# Patient Record
Sex: Female | Born: 1955 | Race: White | Hispanic: No | Marital: Married | Smoking: Never smoker
Health system: Southern US, Community
[De-identification: ages and names within clinical notes are randomized; demographics above are authoritative.]

## PROBLEM LIST (undated history)

## (undated) DIAGNOSIS — I872 Venous insufficiency (chronic) (peripheral): Secondary | ICD-10-CM

## (undated) DIAGNOSIS — Z789 Other specified health status: Secondary | ICD-10-CM

## (undated) HISTORY — PX: TUBAL LIGATION: SHX77

## (undated) HISTORY — DX: Venous insufficiency (chronic) (peripheral): I87.2

## (undated) HISTORY — PX: FOOT SURGERY: SHX648

## (undated) HISTORY — PX: UTERINE FIBROID SURGERY: SHX826

## (undated) HISTORY — PX: BREAST LUMPECTOMY: SHX2

---

## 1999-07-08 ENCOUNTER — Encounter: Payer: Self-pay | Admitting: Obstetrics and Gynecology

## 1999-07-08 ENCOUNTER — Encounter: Admission: RE | Admit: 1999-07-08 | Discharge: 1999-07-08 | Payer: Self-pay | Admitting: Obstetrics and Gynecology

## 2000-07-11 ENCOUNTER — Encounter: Payer: Self-pay | Admitting: Obstetrics and Gynecology

## 2000-07-11 ENCOUNTER — Encounter: Admission: RE | Admit: 2000-07-11 | Discharge: 2000-07-11 | Payer: Self-pay | Admitting: Obstetrics and Gynecology

## 2001-01-02 ENCOUNTER — Emergency Department (HOSPITAL_COMMUNITY): Admission: EM | Admit: 2001-01-02 | Discharge: 2001-01-02 | Payer: Self-pay | Admitting: Emergency Medicine

## 2001-01-02 ENCOUNTER — Encounter: Payer: Self-pay | Admitting: Emergency Medicine

## 2007-01-22 ENCOUNTER — Emergency Department (HOSPITAL_COMMUNITY): Admission: EM | Admit: 2007-01-22 | Discharge: 2007-01-22 | Payer: Self-pay | Admitting: Emergency Medicine

## 2007-11-13 ENCOUNTER — Ambulatory Visit (HOSPITAL_BASED_OUTPATIENT_CLINIC_OR_DEPARTMENT_OTHER): Admission: RE | Admit: 2007-11-13 | Discharge: 2007-11-13 | Payer: Self-pay | Admitting: Orthopaedic Surgery

## 2008-04-18 ENCOUNTER — Emergency Department (HOSPITAL_COMMUNITY): Admission: EM | Admit: 2008-04-18 | Discharge: 2008-04-18 | Payer: Self-pay | Admitting: Emergency Medicine

## 2009-10-01 ENCOUNTER — Ambulatory Visit (HOSPITAL_COMMUNITY): Admission: RE | Admit: 2009-10-01 | Discharge: 2009-10-01 | Payer: Self-pay | Admitting: Obstetrics and Gynecology

## 2009-10-16 ENCOUNTER — Encounter: Admission: RE | Admit: 2009-10-16 | Discharge: 2009-10-16 | Payer: Self-pay | Admitting: Obstetrics and Gynecology

## 2010-01-15 ENCOUNTER — Encounter (INDEPENDENT_AMBULATORY_CARE_PROVIDER_SITE_OTHER): Payer: Self-pay | Admitting: Obstetrics & Gynecology

## 2010-01-15 ENCOUNTER — Ambulatory Visit (HOSPITAL_COMMUNITY): Admission: RE | Admit: 2010-01-15 | Discharge: 2010-01-16 | Payer: Self-pay | Admitting: Obstetrics & Gynecology

## 2010-07-15 LAB — SURGICAL PCR SCREEN
MRSA, PCR: NEGATIVE
Staphylococcus aureus: NEGATIVE

## 2010-07-15 LAB — CBC
HCT: 39.2 % (ref 36.0–46.0)
Hemoglobin: 13.1 g/dL (ref 12.0–15.0)
MCV: 93.2 fL (ref 78.0–100.0)
Platelets: 224 10*3/uL (ref 150–400)
RBC: 3.24 MIL/uL — ABNORMAL LOW (ref 3.87–5.11)
RBC: 4.12 MIL/uL (ref 3.87–5.11)
RDW: 13.9 % (ref 11.5–15.5)
WBC: 9.7 10*3/uL (ref 4.0–10.5)
WBC: 6.9 10*3/uL (ref 4.0–10.5)

## 2010-07-15 LAB — TYPE AND SCREEN: Antibody Screen: NEGATIVE

## 2010-07-15 LAB — ABO/RH: ABO/RH(D): O POS

## 2011-02-03 LAB — POCT I-STAT, CHEM 8
BUN: 27 mg/dL — ABNORMAL HIGH (ref 6–23)
Creatinine, Ser: 1.1 mg/dL (ref 0.4–1.2)
Glucose, Bld: 129 mg/dL — ABNORMAL HIGH (ref 70–99)
Hemoglobin: 13.6 g/dL (ref 12.0–15.0)
Potassium: 3.9 mEq/L (ref 3.5–5.1)
Sodium: 140 mEq/L (ref 135–145)
TCO2: 25 mmol/L (ref 0–100)

## 2011-02-03 LAB — URINE MICROSCOPIC-ADD ON

## 2011-02-03 LAB — URINALYSIS, ROUTINE W REFLEX MICROSCOPIC
Nitrite: NEGATIVE
Protein, ur: NEGATIVE mg/dL
Specific Gravity, Urine: 1.021 (ref 1.005–1.030)
Urobilinogen, UA: 0.2 mg/dL (ref 0.0–1.0)

## 2011-02-03 LAB — PREGNANCY, URINE: Preg Test, Ur: NEGATIVE

## 2011-02-10 LAB — BASIC METABOLIC PANEL
BUN: 18
CO2: 27
GFR calc non Af Amer: 50 — ABNORMAL LOW
Glucose, Bld: 128 — ABNORMAL HIGH
Potassium: 5
Sodium: 139

## 2011-02-10 LAB — DIFFERENTIAL
Basophils Absolute: 0.1
Basophils Relative: 1
Eosinophils Absolute: 0
Eosinophils Relative: 0
Monocytes Absolute: 0.6

## 2011-02-10 LAB — CBC
HCT: 39.2
Hemoglobin: 13.4
MCHC: 34.2
Platelets: 293
RDW: 13.6

## 2011-02-10 LAB — URINALYSIS, ROUTINE W REFLEX MICROSCOPIC
Bilirubin Urine: NEGATIVE
Nitrite: NEGATIVE
Protein, ur: NEGATIVE
Urobilinogen, UA: 0.2

## 2011-02-10 LAB — URINE MICROSCOPIC-ADD ON

## 2011-05-27 ENCOUNTER — Inpatient Hospital Stay (HOSPITAL_COMMUNITY): Payer: BC Managed Care – PPO

## 2011-05-27 ENCOUNTER — Emergency Department (HOSPITAL_COMMUNITY): Payer: BC Managed Care – PPO

## 2011-05-27 ENCOUNTER — Other Ambulatory Visit: Payer: Self-pay

## 2011-05-27 ENCOUNTER — Encounter (HOSPITAL_COMMUNITY): Payer: Self-pay | Admitting: *Deleted

## 2011-05-27 ENCOUNTER — Inpatient Hospital Stay (HOSPITAL_COMMUNITY)
Admission: AD | Admit: 2011-05-27 | Discharge: 2011-06-06 | DRG: 584 | Disposition: A | Discharge status: Home Health | Payer: BC Managed Care – PPO | Source: Ambulatory Visit | Attending: Pulmonary Disease | Admitting: Pulmonary Disease

## 2011-05-27 DIAGNOSIS — R112 Nausea with vomiting, unspecified: Secondary | ICD-10-CM

## 2011-05-27 DIAGNOSIS — J690 Pneumonitis due to inhalation of food and vomit: Secondary | ICD-10-CM

## 2011-05-27 DIAGNOSIS — A419 Sepsis, unspecified organism: Principal | ICD-10-CM | POA: Diagnosis present

## 2011-05-27 DIAGNOSIS — R7989 Other specified abnormal findings of blood chemistry: Secondary | ICD-10-CM | POA: Diagnosis present

## 2011-05-27 DIAGNOSIS — J9690 Respiratory failure, unspecified, unspecified whether with hypoxia or hypercapnia: Secondary | ICD-10-CM

## 2011-05-27 DIAGNOSIS — J9600 Acute respiratory failure, unspecified whether with hypoxia or hypercapnia: Secondary | ICD-10-CM | POA: Diagnosis present

## 2011-05-27 DIAGNOSIS — R404 Transient alteration of awareness: Secondary | ICD-10-CM | POA: Diagnosis present

## 2011-05-27 DIAGNOSIS — J984 Other disorders of lung: Secondary | ICD-10-CM

## 2011-05-27 DIAGNOSIS — N179 Acute kidney failure, unspecified: Secondary | ICD-10-CM

## 2011-05-27 DIAGNOSIS — E870 Hyperosmolality and hypernatremia: Secondary | ICD-10-CM | POA: Diagnosis present

## 2011-05-27 DIAGNOSIS — R6520 Severe sepsis without septic shock: Secondary | ICD-10-CM

## 2011-05-27 DIAGNOSIS — R6521 Severe sepsis with septic shock: Secondary | ICD-10-CM | POA: Diagnosis present

## 2011-05-27 DIAGNOSIS — R410 Disorientation, unspecified: Secondary | ICD-10-CM

## 2011-05-27 HISTORY — DX: Other specified health status: Z78.9

## 2011-05-27 LAB — DIFFERENTIAL
Eosinophils Absolute: 0 10*3/uL (ref 0.0–0.7)
Eosinophils Relative: 0 % (ref 0–5)
Lymphocytes Relative: 9 % — ABNORMAL LOW (ref 12–46)
Lymphs Abs: 0.9 10*3/uL (ref 0.7–4.0)
Monocytes Absolute: 0.6 10*3/uL (ref 0.1–1.0)
Monocytes Relative: 6 % (ref 3–12)

## 2011-05-27 LAB — BASIC METABOLIC PANEL
CO2: 24 mEq/L (ref 19–32)
Calcium: 7.6 mg/dL — ABNORMAL LOW (ref 8.4–10.5)
Chloride: 107 mEq/L (ref 96–112)
Glucose, Bld: 140 mg/dL — ABNORMAL HIGH (ref 70–99)
Potassium: 4.8 mEq/L (ref 3.5–5.1)
Sodium: 139 mEq/L (ref 135–145)

## 2011-05-27 LAB — URINALYSIS, ROUTINE W REFLEX MICROSCOPIC
Bilirubin Urine: NEGATIVE
Glucose, UA: NEGATIVE mg/dL
Hgb urine dipstick: NEGATIVE
Ketones, ur: NEGATIVE mg/dL
Leukocytes, UA: NEGATIVE
Nitrite: NEGATIVE
Protein, ur: 30 mg/dL — AB
Protein, ur: 30 mg/dL — AB
Specific Gravity, Urine: 1.027 (ref 1.005–1.030)
Urobilinogen, UA: 0.2 mg/dL (ref 0.0–1.0)
Urobilinogen, UA: 0.2 mg/dL (ref 0.0–1.0)

## 2011-05-27 LAB — GLUCOSE, CAPILLARY: Glucose-Capillary: 107 mg/dL — ABNORMAL HIGH (ref 70–99)

## 2011-05-27 LAB — COMPREHENSIVE METABOLIC PANEL
ALT: 191 U/L — ABNORMAL HIGH (ref 0–35)
BUN: 31 mg/dL — ABNORMAL HIGH (ref 6–23)
CO2: 26 mEq/L (ref 19–32)
Calcium: 9.4 mg/dL (ref 8.4–10.5)
GFR calc Af Amer: 21 mL/min — ABNORMAL LOW (ref >90)
GFR calc non Af Amer: 18 mL/min — ABNORMAL LOW (ref >90)
Glucose, Bld: 160 mg/dL — ABNORMAL HIGH (ref 70–99)
Sodium: 140 mEq/L (ref 135–145)
Total Protein: 7.1 g/dL (ref 6.0–8.3)

## 2011-05-27 LAB — CBC
HCT: 45.5 % (ref 36.0–46.0)
Hemoglobin: 14.3 g/dL (ref 12.0–15.0)
MCH: 30.6 pg (ref 26.0–34.0)
MCV: 97.2 fL (ref 78.0–100.0)
Platelets: 378 10*3/uL (ref 150–400)
RBC: 4.68 MIL/uL (ref 3.87–5.11)
WBC: 10.3 10*3/uL (ref 4.0–10.5)

## 2011-05-27 LAB — RAPID URINE DRUG SCREEN, HOSP PERFORMED
Amphetamines: NOT DETECTED
Benzodiazepines: NOT DETECTED
Cocaine: NOT DETECTED
Opiates: NOT DETECTED

## 2011-05-27 LAB — TYPE AND SCREEN
ABO/RH(D): O POS
Antibody Screen: NEGATIVE

## 2011-05-27 LAB — CARBOXYHEMOGLOBIN
Methemoglobin: 0.5 % (ref 0.0–1.5)
Methemoglobin: 1 % (ref 0.0–1.5)
O2 Saturation: 62.4 %
Total hemoglobin: 12.8 g/dL (ref 12.5–16.0)
Total hemoglobin: 12.2 g/dL — ABNORMAL LOW (ref 12.5–16.0)
Total hemoglobin: 11.7 g/dL — ABNORMAL LOW (ref 12.5–16.0)

## 2011-05-27 LAB — POCT I-STAT 3, ART BLOOD GAS (G3+)
Acid-base deficit: 4 mmol/L — ABNORMAL HIGH (ref 0.0–2.0)
O2 Saturation: 95 %
TCO2: 23 mmol/L (ref 0–100)
pCO2 arterial: 43.2 mmHg (ref 35.0–45.0)
pO2, Arterial: 80 mmHg (ref 80.0–100.0)

## 2011-05-27 LAB — LIPASE, BLOOD: Lipase: 152 U/L — ABNORMAL HIGH (ref 11–59)

## 2011-05-27 LAB — INFLUENZA PANEL BY PCR (TYPE A & B)
H1N1 flu by pcr: NOT DETECTED
Influenza B By PCR: NEGATIVE

## 2011-05-27 LAB — URINE MICROSCOPIC-ADD ON

## 2011-05-27 LAB — FIBRINOGEN: Fibrinogen: 392 mg/dL (ref 204–475)

## 2011-05-27 LAB — ACETAMINOPHEN LEVEL: Acetaminophen (Tylenol), Serum: <15 ug/mL (ref 10–30)

## 2011-05-27 LAB — LACTIC ACID, PLASMA
Lactic Acid, Venous: 5.4 mmol/L — ABNORMAL HIGH (ref 0.5–2.2)
Lactic Acid, Venous: 2.8 mmol/L — ABNORMAL HIGH (ref 0.5–2.2)

## 2011-05-27 LAB — GASTRIC OCCULT BLOOD (1-CARD TO LAB): Occult Blood, Gastric: POSITIVE — AB

## 2011-05-27 LAB — ETHANOL: Alcohol, Ethyl (B): <11 mg/dL (ref 0–11)

## 2011-05-27 MED ORDER — SODIUM CHLORIDE 0.9 % IV SOLN
2.0000 mg/h | INTRAVENOUS | Status: DC
  Administered 2011-05-27 – 2011-05-29 (×3): 3 mg/h via INTRAVENOUS
  Filled 2011-05-27 (×4): qty 10

## 2011-05-27 MED ORDER — SODIUM CHLORIDE 0.9 % IV BOLUS (SEPSIS)
1000.0000 mL | Freq: Once | INTRAVENOUS | Status: AC
  Administered 2011-05-27: 1000 mL via INTRAVENOUS

## 2011-05-27 MED ORDER — ETOMIDATE 2 MG/ML IV SOLN
INTRAVENOUS | Status: AC
  Administered 2011-05-27: 20 mg
  Filled 2011-05-27: qty 20

## 2011-05-27 MED ORDER — NALOXONE HCL 1 MG/ML IJ SOLN
INTRAMUSCULAR | Status: AC
  Filled 2011-05-27: qty 2

## 2011-05-27 MED ORDER — ASPIRIN 81 MG PO CHEW
324.0000 mg | CHEWABLE_TABLET | ORAL | Status: AC
  Administered 2011-05-27: 324 mg via ORAL
  Filled 2011-05-27: qty 4

## 2011-05-27 MED ORDER — PIPERACILLIN-TAZOBACTAM 3.375 G IVPB 30 MIN: 3.3750 g | Freq: Once | INTRAVENOUS | Status: DC

## 2011-05-27 MED ORDER — FAMOTIDINE IN NACL 20-0.9 MG/50ML-% IV SOLN
20.0000 mg | INTRAVENOUS | Status: DC
  Filled 2011-05-27: qty 50

## 2011-05-27 MED ORDER — VASOPRESSIN 20 UNIT/ML IJ SOLN
0.0300 [IU]/min | INTRAVENOUS | Status: DC | PRN
  Administered 2011-05-27: 0.03 [IU]/min via INTRAVENOUS
  Filled 2011-05-27: qty 2.5

## 2011-05-27 MED ORDER — SODIUM CHLORIDE 0.9 % IV SOLN
100.0000 ug/h | INTRAVENOUS | Status: DC
  Administered 2011-05-29: 125 ug/h via INTRAVENOUS
  Administered 2011-05-30: 100 ug/h via INTRAVENOUS
  Administered 2011-05-30: 200 ug/h via INTRAVENOUS
  Administered 2011-05-31: 100 ug/h via INTRAVENOUS
  Filled 2011-05-27 (×6): qty 50

## 2011-05-27 MED ORDER — VANCOMYCIN HCL IN DEXTROSE 1-5 GM/200ML-% IV SOLN
1000.0000 mg | Freq: Once | INTRAVENOUS | Status: DC
  Filled 2011-05-27: qty 200

## 2011-05-27 MED ORDER — SODIUM CHLORIDE 0.9 % IV SOLN
INTRAVENOUS | Status: DC
  Administered 2011-05-28: 10:00:00 via INTRAVENOUS
  Administered 2011-05-29 – 2011-06-01 (×2): 20 mL/h via INTRAVENOUS

## 2011-05-27 MED ORDER — VANCOMYCIN HCL 1000 MG IV SOLR: 1000.0000 mg | Freq: Once | INTRAVENOUS | Status: DC

## 2011-05-27 MED ORDER — SODIUM CHLORIDE 0.9 % IJ SOLN
INTRAMUSCULAR | Status: AC
  Administered 2011-05-27: 11:00:00
  Filled 2011-05-27: qty 10

## 2011-05-27 MED ORDER — SODIUM CHLORIDE 0.9 % IV BOLUS (SEPSIS)
500.0000 mL | INTRAVENOUS | Status: DC | PRN
  Administered 2011-05-28: 500 mL via INTRAVENOUS

## 2011-05-27 MED ORDER — BIOTENE DRY MOUTH MT LIQD: 15.0000 mL | Freq: Four times a day (QID) | OROMUCOSAL | Status: DC

## 2011-05-27 MED ORDER — PIPERACILLIN-TAZOBACTAM 3.375 G IVPB
3.3750 g | Freq: Once | INTRAVENOUS | Status: AC
  Administered 2011-05-27: 3.375 g via INTRAVENOUS

## 2011-05-27 MED ORDER — ACETAMINOPHEN 650 MG RE SUPP: 650.0000 mg | RECTAL | Status: DC | PRN

## 2011-05-27 MED ORDER — ROCURONIUM BROMIDE 50 MG/5ML IV SOLN
INTRAVENOUS | Status: AC
  Administered 2011-05-27: 75 mg
  Filled 2011-05-27: qty 2

## 2011-05-27 MED ORDER — ACETAMINOPHEN 325 MG PO TABS
650.0000 mg | ORAL_TABLET | Freq: Three times a day (TID) | ORAL | Status: DC | PRN
  Administered 2011-05-28 – 2011-05-30 (×6): 650 mg via ORAL
  Filled 2011-05-27: qty 1
  Filled 2011-05-27 (×2): qty 2
  Filled 2011-05-27: qty 1
  Filled 2011-05-27 (×3): qty 2

## 2011-05-27 MED ORDER — SODIUM CHLORIDE 0.9 % IV SOLN: 250.0000 mL | INTRAVENOUS | Status: DC | PRN

## 2011-05-27 MED ORDER — FENTANYL BOLUS VIA INFUSION
50.0000 ug | Freq: Four times a day (QID) | INTRAVENOUS | Status: DC | PRN
  Filled 2011-05-27 (×2): qty 100

## 2011-05-27 MED ORDER — BIOTENE DRY MOUTH MT LIQD
15.0000 mL | Freq: Four times a day (QID) | OROMUCOSAL | Status: DC
  Administered 2011-05-27 – 2011-06-04 (×29): 15 mL via OROMUCOSAL

## 2011-05-27 MED ORDER — BIOTENE ORALBALANCE DRY MOUTH MT LIQD: 15.0000 mL | Freq: Four times a day (QID) | OROMUCOSAL | Status: DC

## 2011-05-27 MED ORDER — NOREPINEPHRINE BITARTRATE 1 MG/ML IJ SOLN
2.0000 ug/min | INTRAVENOUS | Status: DC | PRN
  Administered 2011-05-27 – 2011-05-28 (×2): 5 ug/min via INTRAVENOUS
  Filled 2011-05-27 (×3): qty 4

## 2011-05-27 MED ORDER — SODIUM CHLORIDE 0.9 % IV SOLN
INTRAVENOUS | Status: DC
  Administered 2011-05-27: 125 mL via INTRAVENOUS

## 2011-05-27 MED ORDER — LORAZEPAM 2 MG/ML IJ SOLN
INTRAMUSCULAR | Status: AC
  Administered 2011-05-27: 2 mg
  Filled 2011-05-27: qty 1

## 2011-05-27 MED ORDER — PANTOPRAZOLE SODIUM 40 MG IV SOLR
40.0000 mg | INTRAVENOUS | Status: DC
  Administered 2011-05-27: 40 mg via INTRAVENOUS
  Filled 2011-05-27 (×2): qty 40

## 2011-05-27 MED ORDER — SODIUM CHLORIDE 0.9 % IV BOLUS (SEPSIS)
500.0000 mL | Freq: Once | INTRAVENOUS | Status: AC
  Administered 2011-05-27: 500 mL via INTRAVENOUS

## 2011-05-27 MED ORDER — CHLORHEXIDINE GLUCONATE 0.12 % MT SOLN
15.0000 mL | Freq: Two times a day (BID) | OROMUCOSAL | Status: DC
  Administered 2011-05-27 – 2011-06-03 (×14): 15 mL via OROMUCOSAL
  Filled 2011-05-27 (×15): qty 15

## 2011-05-27 MED ORDER — MIDAZOLAM BOLUS VIA INFUSION
1.0000 mg | INTRAVENOUS | Status: DC | PRN
  Filled 2011-05-27: qty 2

## 2011-05-27 MED ORDER — PIPERACILLIN-TAZOBACTAM 3.375 G IVPB
INTRAVENOUS | Status: AC
  Administered 2011-05-27: 0.75 g via INTRAVENOUS
  Filled 2011-05-27: qty 100

## 2011-05-27 MED ORDER — PIPERACILLIN-TAZOBACTAM 4.5 G IVPB
4.5000 g | Freq: Once | INTRAVENOUS | Status: DC
  Filled 2011-05-27: qty 100

## 2011-05-27 MED ORDER — DOBUTAMINE IN D5W 4-5 MG/ML-% IV SOLN
2.5000 ug/kg/min | INTRAVENOUS | Status: DC | PRN
  Administered 2011-05-27: 2.5 ug/kg/min via INTRAVENOUS
  Filled 2011-05-27: qty 250

## 2011-05-27 MED ORDER — VANCOMYCIN HCL IN DEXTROSE 1-5 GM/200ML-% IV SOLN
1000.0000 mg | INTRAVENOUS | Status: DC
  Administered 2011-05-27 – 2011-05-28 (×2): 1000 mg via INTRAVENOUS
  Filled 2011-05-27 (×2): qty 200

## 2011-05-27 MED ORDER — SUCCINYLCHOLINE CHLORIDE 20 MG/ML IJ SOLN
INTRAMUSCULAR | Status: AC
  Filled 2011-05-27: qty 10

## 2011-05-27 MED ORDER — IPRATROPIUM-ALBUTEROL 18-103 MCG/ACT IN AERO
4.0000 | INHALATION_SPRAY | RESPIRATORY_TRACT | Status: DC
  Administered 2011-05-27 – 2011-05-30 (×19): 4 via RESPIRATORY_TRACT
  Filled 2011-05-27: qty 14.7

## 2011-05-27 MED ORDER — SODIUM CHLORIDE 0.9 % IV BOLUS (SEPSIS)
25.0000 mL/kg | Freq: Once | INTRAVENOUS | Status: AC
  Administered 2011-05-27: 2040 mL via INTRAVENOUS

## 2011-05-27 MED ORDER — LIDOCAINE HCL (CARDIAC) 20 MG/ML IV SOLN
INTRAVENOUS | Status: AC
  Filled 2011-05-27: qty 5

## 2011-05-27 MED ORDER — ASPIRIN 300 MG RE SUPP
300.0000 mg | RECTAL | Status: AC
  Filled 2011-05-27: qty 1

## 2011-05-27 MED ORDER — PIPERACILLIN-TAZOBACTAM 3.375 G IVPB
3.3750 g | Freq: Three times a day (TID) | INTRAVENOUS | Status: DC
  Administered 2011-05-27 – 2011-05-31 (×12): 3.375 g via INTRAVENOUS
  Filled 2011-05-27 (×15): qty 50

## 2011-05-27 MED ORDER — SODIUM CHLORIDE 0.9 % IV SOLN
2.0000 mg/h | INTRAVENOUS | Status: AC
  Administered 2011-05-27: 2 mg/h via INTRAVENOUS
  Filled 2011-05-27: qty 10

## 2011-05-27 MED ORDER — PIPERACILLIN-TAZOBACTAM 3.375 G IVPB 30 MIN
4.5000 g | Freq: Once | INTRAVENOUS | Status: DC
  Administered 2011-05-27: 0.75 g via INTRAVENOUS

## 2011-05-27 MED ORDER — SODIUM CHLORIDE 0.9 % IV SOLN
75.0000 ug/h | INTRAVENOUS | Status: AC
  Administered 2011-05-27: 75 ug/h via INTRAVENOUS
  Filled 2011-05-27: qty 50

## 2011-05-27 MED ORDER — FENTANYL CITRATE 0.05 MG/ML IJ SOLN
INTRAMUSCULAR | Status: AC
  Administered 2011-05-27: 100 ug
  Filled 2011-05-27: qty 2

## 2011-05-27 MED ORDER — HEPARIN SODIUM (PORCINE) 5000 UNIT/ML IJ SOLN
5000.0000 [IU] | Freq: Three times a day (TID) | INTRAMUSCULAR | Status: DC
  Administered 2011-05-27 – 2011-06-06 (×30): 5000 [IU] via SUBCUTANEOUS
  Filled 2011-05-27 (×34): qty 1

## 2011-05-27 NOTE — ED Notes (Signed)
No previous EKG in [facility]

## 2011-05-27 NOTE — Progress Notes (Signed)
OnCall paged to ED.  Provided support to pt's husband.  Pt's husband was appropriately distraught and valued information.  Was calling family to inform.   Chaplain was present for conference with MD.     05/27/11 0600  Clinical Encounter Type  Visited With Family  Visit Type ED  Referral From Nurse  Consult/Referral To Chaplain  Stress Factors  Family Stress Factors Health changes

## 2011-05-27 NOTE — Progress Notes (Addendum)
Name: Valerie Perry MRN: [id number] DOB: 07/13/1955  LOS: 0  CRITICAL CARE FU NOTE  History of Present Illness: 56 y/o female with no past medical history presented to the ED with aspiration and respiratory failure.  S/p  foot surgery a week ago.  She has had multiple procedures in the past and "does not like taking pain meds" and according to him she was taking very little of her pain meds.  On 1/24 she told him that she thought she had caught the flu as she had profound nausea and vomiting all day.  He spoke with her on the phone at 3:00 PM on 1/24.  She appeared fine (but was sleeping) when he checked on her again at 1600 and 2200 on 1/24.  When he checked on her on 1/25 at 0445 she was minimally responsive and had emesis all over her face.  She was brought here and promptly intubated.  Lines / Drains: 1/25 ETT >> 1/25 L subclavian CVL >>  Cultures / Sepsis markers: 1/25 blood >> 1/25 sputum >> 1/25 urine >> 1/25 procalc >> 1/25 flu >>  Antibiotics: 1/25 zosyn >> 1/25 vanc >>  Tests / Events: 1/25 head CT neg     Vital Signs:   Filed Vitals:   05/27/11 1545  BP: 109/54  Pulse: 89  Temp: 100 F (37.8 C)  Resp: 13    Physical Examination: Gen: sedated on vent HEENT: NCAT, PERRL, EOMi, OP clear, ETT in place Neck: supple without masses PULM: rhonchi bilaterally CV: tachy, no mgr, no JVD AB: BS+, soft, nontender, no hsm Ext: warm, no edema, no clubbing, no cyanosis, s/p surgery L foot, wound c/d/i Derm: no rash or skin breakdown Neuro: sedated on vent Psyche: cannot assess  Labs and Imaging:   CBC    Component Value Date/Time   WBC 10.3 05/27/2011 0600   RBC 4.68 05/27/2011 0600   HGB 14.3 05/27/2011 0600   HCT 45.5 05/27/2011 0600   PLT 378 05/27/2011 0600   MCV 97.2 05/27/2011 0600   MCH 30.6 05/27/2011 0600   MCHC 31.4 05/27/2011 0600   RDW 14.2 05/27/2011 0600   LYMPHSABS 0.9 05/27/2011 0600   MONOABS 0.6 05/27/2011 0600   EOSABS 0.0 05/27/2011 0600   BASOSABS 0.0  05/27/2011 0600    BMET    Component Value Date/Time   NA 139 05/27/2011 1100   K 4.8 05/27/2011 1100   CL 107 05/27/2011 1100   CO2 24 05/27/2011 1100   GLUCOSE 140* 05/27/2011 1100   BUN 28* 05/27/2011 1100   CREATININE 1.88* 05/27/2011 1100   CALCIUM 7.6* 05/27/2011 1100   GFRNONAA 29* 05/27/2011 1100   GFRAA 34* 05/27/2011 1100      Assessment and Plan: 56 y/o female with no past medical history admitted 1/25 for aspiration pneumonia in setting of taking pain meds at home. Her husband does not believe that she overdosed and by counting her pill bottles it appears that she has taken very little of the pain meds prescribed after her recent foot surgery.  It is unclear why she has nausea and vomiting (viral gastroenteritis vs. Flu vs. Less likely obstruction).  She appears profoundly hypoxemic and has severe sepsis by vital signs.   Aspiration pneumonia (05/27/2011)   Assessment: with severe hypoxemia   Plan:  -full vent support  -send sputum and blood cultures -zosyn / add vanc  Respiratory failure (05/27/2011)   Assessment: due to aspiration pnuemonia and delirium   Plan:  -ARDS protocol -  60%/ PEEP 10 - titrate PEEP upwards if unable to lower FIO2 -pulm toilette -fentanyl/versed for sedation -daily abg/cxr - unable to obtain a-line -daily wua and sbt  Nausea and vomiting/ Elevated LFTs (05/27/2011)   Assessment: gastroenteritis vs. Ileus vs. sbo?   Plan:  -kub -OG tube to LIS x 24h -tylenol level ok  Severe sepsis (05/27/2011)   Assessment: due to    Plan:  -sepsis protocol, EGTD -central line in positioned -antibiotics as above -lactate clearance good prognostic sign  Delirium (05/27/2011)   Assessment: due to narcotics? Due to hypoxemia?   Plan:  -head CT neg -sedation for ARDS protocol  Pos trops  -echo, cards consult, may need stress test for risk stratification once acute issues resolved  AKI (acute kidney injury) (05/27/2011)   Assessment: due to severe sepsis  vs. Pre renal from n/v   Plan:  -volume resuscitation per sepsis protocol -follow uop -daily bmet  -repeat bmet at 1400 today  Best practices / Disposition: PCCM primary service, ICU level care  Feeding/protein malnutrition: npo Analgesia: fentanyl Sedation: versed Thromboprophylaxis: sub q hep HOB >30 degrees Ulcer prophylaxis: pepcid Glucose control/hyperglycemia: monitor cbg  The patient is critically ill with multiple organ systems failure and requires high complexity decision making for assessment and support, frequent evaluation and titration of therapies, application of advanced monitoring technologies and extensive interpretation of multiple databases. Critical Care Time devoted to patient care services described in this note is 40 minutes.  Cyril Mourning MD. Tonny Bollman. [facility] Pager [phone] If no response call [phone]    05/27/2011, 4:26 PM

## 2011-05-27 NOTE — Procedures (Signed)
Central Venous Catheter Insertion Procedure Note MURRY KHIEV [id number] 03/20/1956  Procedure: Insertion of Central Venous Catheter Indications: Assessment of intravascular volume  Procedure Details Consent: Risks of procedure as well as the alternatives and risks of each were explained to the (patient/caregiver).  Consent for procedure obtained. Time Out: Verified patient identification, verified procedure, site/side was marked, verified correct patient position, special equipment/implants available, medications/allergies/relevent history reviewed, required imaging and test results available.  Performed  Maximum sterile technique was used including antiseptics, cap, gloves, gown, hand hygiene, mask and sheet. Skin prep: Chlorhexidine; local anesthetic administered A antimicrobial bonded/coated triple lumen catheter was placed in the left subclavian vein using the Seldinger technique. Ultrasound used for guidance.  Evaluation Blood flow good Complications: No apparent complications Patient did tolerate procedure well. Chest X-ray ordered to verify placement.  CXR: normal.  MCQUAID, DOUGLAS 05/27/2011, 7:45 AM

## 2011-05-27 NOTE — ED Notes (Signed)
Per EMS: pt husband checked on and pt was asleep. 45 min. Later husband checked on pt again and pt was agonally breathing. Husband called EMS. Pt was found unresponsive not able to open eyes or respond to verbal or painful stimuli. Pt stats were 80%RA. IV started 2mg  of narcan given and pt started opening eyes. Pt stats also went to 97% on NRB. Pt had surgery 2 weeks ago for left foot. Pt has pain medication from surgery. Pt husband states that she was complaining of congestion for the past day or two. Pt looks like she vomited recently.

## 2011-05-27 NOTE — Procedures (Signed)
Name: Valerie Perry MRN: [id number] DOB: 07/02/1955  DOS:  PROCEDURE NOTE  Procedure:  Arterial catheter placement.  Indications:  Need for invasive hemodynamic monitoring / frequent arterial blood gases measurement.  Consent:  Consent was implied due to the emergency nature of the procedure.  Procedure summary:  The patient was identified as Valerie Perry and safety timeout was performed. Collateral arterial flow was confirmed. Sterile technique was used. The patient's right and left femoral area was prepped using chlorhexidine / alcohol scrub and the field was draped in usual sterile fashion with protective barrier. The left and right femoral artery was cannulated with difficulty. Blood was not able to aspirated after attempts on left and right femoral artery.  Complications:  No immediate complications were noted.  Estimated blood loss:  Less then 5 mL  SCHOOLER, KAREN 05/27/2011, 3:26 PM

## 2011-05-27 NOTE — Progress Notes (Signed)
Name: Valerie Perry MRN: [id number] DOB: Apr 19, 1956    LOS: 0  PCCM Brief Progress NOTE  History of Present Illness: 56 y/o female with history of recent nausea and vomiting and "not feeling well" in setting of taking pain meds, found minimally responsive  per husband report, admitted to ICU with profound hypoxemia and subsequent elevation of cardiac enzymes.  Lines / Drains: 1/25 ETT >>  1/25 L subclavian CVL >>  Cultures: 1/25 blood >>  1/25 sputum >>  1/25 urine >>  1/25 procalc >>  1/25 flu >>   Antibiotics: 1/25 zosyn >>  1/25 vanc >>  Tests / Events: 1/25 head CT neg 1/25 Abd U/S  neg 1/25 CXR consolidation left lung apex with pulm vascular congestion  Vital Signs: Temp:  [99.3 F (37.4 C)-102.9 F (39.4 C)] 99.3 F (37.4 C) (01/25 1800) Pulse Rate:  [75-114] 82  (01/25 1800) Resp:  [11-33] 14  (01/25 1800) BP: (82-162)/(40-94) 125/53 mmHg (01/25 1800) SpO2:  [90 %-100 %] 99 % (01/25 1800) FiO2 (%):  [60 %-100 %] 60 % (01/25 1716) Weight:  [180 lb (81.647 kg)-196 lb 13.9 oz (89.3 kg)] 196 lb 13.9 oz (89.3 kg) (01/25 0845)    Physical Examination: Gen: sedated on vent  HEENT: NCAT, ETT in place  Neck: supple without masses  PULM: rhonchi bilaterally  CV: RRR, no mgr appreciated, no JVD appreciated AB: BS+, soft, nontender, no hsm  Ext: warm, no edema, no clubbing, no cyanosis, s/p surgery L foot, wound c/d/i  Neuro: sedated on vent   Ventilator settings: Vent Mode:  [-] PRVC FiO2 (%):  [60 %-100 %] 60 % Set Rate:  [15 bmp-16 bmp] 16 bmp Vt Set:  [420 mL] 420 mL PEEP:  [5 cmH20-10.4 cmH20] 10 cmH20 Plateau Pressure:  [19 cmH20-20 cmH20] 20 cmH20  Labs and Imaging:  Reviewed.  Please refer to the Assessment and Plan section for relevant results. Basic Metabolic Panel:  Basename 05/27/11 1100 05/27/11 0600  NA 139 140  K 4.8 5.9*  CL 107 99  CO2 24 26  GLUCOSE 140* 160*  BUN 28* 31*  CREATININE 1.88* 2.78*  CALCIUM 7.6* 9.4  MG -- --  PHOS -- --    Liver Function Tests:  Basename 05/27/11 0600  AST 140*  ALT 191*  ALKPHOS 79  BILITOT 0.4  PROT 7.1  ALBUMIN 3.7    Basename 05/27/11 0600  LIPASE 152*  AMYLASE --   CBC:  Basename 05/27/11 0600  WBC 10.3  NEUTROABS 8.8*  HGB 14.3  HCT 45.5  MCV 97.2  PLT 378   Cardiac Enzymes:  Basename 05/27/11 1100 05/27/11 0600  CKTOTAL 1122* --  CKMB 11.2* --  CKMBINDEX -- --  TROPONINI 1.94* 0.53*   CBG:  Basename 05/27/11 1005  GLUCAP 107*   Coagulation:  Basename 05/27/11 0600  LABPROT 15.6*  INR 1.21   Urine Drug Screen: Drugs of Abuse     Component Value Date/Time   LABOPIA NONE DETECTED 05/27/2011 0606   COCAINSCRNUR NONE DETECTED 05/27/2011 0606   LABBENZ NONE DETECTED 05/27/2011 0606   AMPHETMU NONE DETECTED 05/27/2011 0606   THCU NONE DETECTED 05/27/2011 0606   LABBARB NONE DETECTED 05/27/2011 0606    Alcohol Level:  Basename 05/27/11 0600  ETH <11     Assessment and Plan: 56 y/o female with no past medical history admitted 1/25 for aspiration pneumonia with respiratory distress and severe sepsis.  1. Respiratory failure: likely aspiration pneumonia associated with vomiting and delirium.  Plan -ARDS protocol - 60%/ PEEP 10 - titrate PEEP upwards if unable to lower FIO2  -pulm toilette  -fentanyl/versed for sedation  -was unable to obtain Arterial line after attempts bilaterally, will re-assess tomorrow -daily wua and sbt  Lab 05/27/11 1255 05/27/11 1049 05/27/11 0925  PHART -- 7.316* --  PCO2ART -- 43.2 --  PO2ART -- 80.0 --  HCO3 -- 22.1 --  TCO2 -- 23 --  O2SAT 85.6 95.0 62.4    2. Sepsis, severe: currently on vasopressin and norepinephrine, lactic acid trending towards normal, BCX pending, KUB without obstruction or ileus Plan -sepsis protocol, EGTD  -central line in positioned  -continue Vancomycin and Zosyn white blood cell count  Lab 05/27/11 0600  WBC 10.3    3. Delirium: unclear etiology, pt without response to Narcan in  ED, husband reports pt does not like to take pain medications, Acetaminophen level low, CT of head without acute findings Plan -Continue sedation  4. Elevated Cardiac Enzymes: EKG with 1mm ST depression in lateral leads, likely secondary to demand ischemia, Cardiology consulted for further recommendations Plan -ECHO pending -continue to monitor cardiac enzymes -will need risk stratification   Lab 05/27/11 1100 05/27/11 0600  TROPONINI 1.94* 0.53*   5. Acute Kidney Injury:  Likely due to severe sepsis vs pre-renal from nausea and vomiting. Patients' PCP is listed as Synetta Fail [phone].  Would like to get further information and records to establish patient's baseline creatinine. Plan -continue volume resuscitation per sepsis protocol -follow uop -continue daily BMET   Lab 05/27/11 1100 05/27/11 0600  CREATININE 1.88* 2.78*    Intake/Output Summary (Last 24 hours) at 05/27/11 1827 Last data filed at 05/27/11 1800  Gross per 24 hour  Intake 3327.94 ml  Output    520 ml  Net 2807.94 ml   6. Disposition: spoke with husband and obtained further information concerning Podiatrist who performed pt recent bunion surgery.  Office closed due to current local weather conditions.  Would like to get further recommendation concerning post-op care.   Plan -contact Monday morning, [facility] in [place], [place] [phone]     Kristie Cowman 05/27/2011, 6:08 PM

## 2011-05-27 NOTE — ED Notes (Signed)
Foley catheter inserted without resistance.  Yellowish brown urine returned

## 2011-05-27 NOTE — ED Notes (Signed)
Critical Care MD at bedside for central line placement, husband signed consent form. Per Husband pt has been complaining of flu like symptoms for the past couple of days. At 15:00 yesterday afternoon pt was complaining of being sick to her stomach and vomiting. Pt husband continued to check on pt throughout the night since she went to sleep at 16:00. Pt was found approximately 05:30 this morning with vomit all over her face and not responding.

## 2011-05-27 NOTE — ED Notes (Signed)
Attempted to call report charge nurse unable to take at this time. 

## 2011-05-27 NOTE — Consult Note (Signed)
Reason for Consult:  Elevated troponin. Referring Physician:   HPI:  Valerie Perry is an 56 y.o. Female who is currently intubated and on a vent.  No family members are currently present.  According to other's notes she recently had left foot surgery.  Her husband reported nausea and vomiting on 1/24.  He had been checking on her regularly and on 1/25 found her unresponsive with emesis on her face.  EKG showed sinus rhythm with 1mm st depression in lateral leads.  Troponin has increased from 0.53 to 1.94. She is being treated for aspiration pneumonia, resp failure.  Past Medical History  Diagnosis Date  . No pertinent past medical history     Past Surgical History  Procedure Date  . Foot surgery     No family history on file.  Social History:  reports that she has never smoked. She does not have any smokeless tobacco history on file. Her alcohol and drug histories not on file.  Allergies:  Allergies  Allergen Reactions  . Codeine     Upset stomach    Medications:    . albuterol-ipratropium  4 puff Inhalation Q4H  . antiseptic oral rinse  15 mL Mouth Rinse QID  . aspirin  324 mg Oral NOW   Or  . aspirin  300 mg Rectal NOW  . chlorhexidine  15 mL Mouth/Throat BID  . etomidate      . fentaNYL      . fentaNYL infusion INTRAVENOUS  75 mcg/hr Intravenous To Major  . heparin  5,000 Units Subcutaneous Q8H  . lidocaine (cardiac) 100 mg/68ml      . LORazepam      . midazolam (VERSED) infusion  2 mg/hr Intravenous To Major  . naloxone      . pantoprazole (PROTONIX) IV  40 mg Intravenous Q24H  . piperacillin-tazobactam (ZOSYN)  IV  3.375 g Intravenous Once  . piperacillin-tazobactam (ZOSYN)  IV  3.375 g Intravenous Q8H  . rocuronium      . sodium chloride  1,000 mL Intravenous Once  . sodium chloride  1,000 mL Intravenous Once  . sodium chloride  25 mL/kg Intravenous Once  . sodium chloride  500 mL Intravenous Once  . sodium chloride      . succinylcholine      . vancomycin   1,000 mg Intravenous Q24H  . DISCONTD: antiseptic oral rinse  15 mL Mouth Rinse QID  . DISCONTD: Biotene OralBalance Dry Mouth  15 mL Mouth/Throat QID  . DISCONTD: famotidine (PEPCID) IV  20 mg Intravenous Q24H  . DISCONTD: piperacillin-tazobactam  3.375 g Intravenous Once  . DISCONTD: piperacillin-tazobactam  4.5 g Intravenous Once  . DISCONTD: piperacillin-tazobactam  4.5 g Intravenous Once  . DISCONTD: vancomycin (VANCOCIN) 1000 mg IVPB  1,000 mg Intravenous Once  . DISCONTD: vancomycin  1,000 mg Intravenous Once    Results for orders placed during the hospital encounter of 05/27/11 (from the past 48 hour(s))  CBC     Status: Normal   Collection Time   05/27/11  6:00 AM      Component Value Range Comment   WBC 10.3  4.0 - 10.5 (K/uL)    RBC 4.68  3.87 - 5.11 (MIL/uL)    Hemoglobin 14.3  12.0 - 15.0 (g/dL)    HCT 16.1  09.6 - 04.5 (%)    MCV 97.2  78.0 - 100.0 (fL)    MCH 30.6  26.0 - 34.0 (pg)    MCHC 31.4  30.0 - 36.0 (  g/dL)    RDW 16.1  09.6 - 04.5 (%)    Platelets 378  150 - 400 (K/uL)   DIFFERENTIAL     Status: Abnormal   Collection Time   05/27/11  6:00 AM      Component Value Range Comment   Neutrophils Relative 85 (*) 43 - 77 (%)    Neutro Abs 8.8 (*) 1.7 - 7.7 (K/uL)    Lymphocytes Relative 9 (*) 12 - 46 (%)    Lymphs Abs 0.9  0.7 - 4.0 (K/uL)    Monocytes Relative 6  3 - 12 (%)    Monocytes Absolute 0.6  0.1 - 1.0 (K/uL)    Eosinophils Relative 0  0 - 5 (%)    Eosinophils Absolute 0.0  0.0 - 0.7 (K/uL)    Basophils Relative 0  0 - 1 (%)    Basophils Absolute 0.0  0.0 - 0.1 (K/uL)   COMPREHENSIVE METABOLIC PANEL     Status: Abnormal   Collection Time   05/27/11  6:00 AM      Component Value Range Comment   Sodium 140  135 - 145 (mEq/L)    Potassium 5.9 (*) 3.5 - 5.1 (mEq/L)    Chloride 99  96 - 112 (mEq/L)    CO2 26  19 - 32 (mEq/L)    Glucose, Bld 160 (*) 70 - 99 (mg/dL)    BUN 31 (*) 6 - 23 (mg/dL)    Creatinine, Ser 4.09 (*) 0.50 - 1.10 (mg/dL)    Calcium  9.4  8.4 - 10.5 (mg/dL)    Total Protein 7.1  6.0 - 8.3 (g/dL)    Albumin 3.7  3.5 - 5.2 (g/dL)    AST 811 (*) 0 - 37 (U/L)    ALT 191 (*) 0 - 35 (U/L)    Alkaline Phosphatase 79  39 - 117 (U/L)    Total Bilirubin 0.4  0.3 - 1.2 (mg/dL)    GFR calc non Af Amer 18 (*) >90 (mL/min)    GFR calc Af Amer 21 (*) >90 (mL/min)   LACTIC ACID, PLASMA     Status: Abnormal   Collection Time   05/27/11  6:00 AM      Component Value Range Comment   Lactic Acid, Venous 5.4 (*) 0.5 - 2.2 (mmol/L)   PROCALCITONIN     Status: Normal   Collection Time   05/27/11  6:00 AM      Component Value Range Comment   Procalcitonin 3.96     LIPASE, BLOOD     Status: Abnormal   Collection Time   05/27/11  6:00 AM      Component Value Range Comment   Lipase 152 (*) 11 - 59 (U/L)   TROPONIN I     Status: Abnormal   Collection Time   05/27/11  6:00 AM      Component Value Range Comment   Troponin I 0.53 (*) <0.30 (ng/mL)   ETHANOL     Status: Normal   Collection Time   05/27/11  6:00 AM      Component Value Range Comment   Alcohol, Ethyl (B) <11  0 - 11 (mg/dL)   PROTIME-INR     Status: Abnormal   Collection Time   05/27/11  6:00 AM      Component Value Range Comment   Prothrombin Time 15.6 (*) 11.6 - 15.2 (seconds)    INR 1.21  0.00 - 1.49    APTT  Status: Normal   Collection Time   05/27/11  6:00 AM      Component Value Range Comment   aPTT 27  24 - 37 (seconds)   URINALYSIS, ROUTINE W REFLEX MICROSCOPIC     Status: Abnormal   Collection Time   05/27/11  6:06 AM      Component Value Range Comment   Color, Urine YELLOW  YELLOW     APPearance CLOUDY (*) CLEAR     Specific Gravity, Urine 1.020  1.005 - 1.030     pH 5.0  5.0 - 8.0     Glucose, UA NEGATIVE  NEGATIVE (mg/dL)    Hgb urine dipstick NEGATIVE  NEGATIVE     Bilirubin Urine NEGATIVE  NEGATIVE     Ketones, ur NEGATIVE  NEGATIVE (mg/dL)    Protein, ur 30 (*) NEGATIVE (mg/dL)    Urobilinogen, UA 0.2  0.0 - 1.0 (mg/dL)    Nitrite NEGATIVE   NEGATIVE     Leukocytes, UA NEGATIVE  NEGATIVE    URINE RAPID DRUG SCREEN (HOSP PERFORMED)     Status: Normal   Collection Time   05/27/11  6:06 AM      Component Value Range Comment   Opiates NONE DETECTED  NONE DETECTED     Cocaine NONE DETECTED  NONE DETECTED     Benzodiazepines NONE DETECTED  NONE DETECTED     Amphetamines NONE DETECTED  NONE DETECTED     Tetrahydrocannabinol NONE DETECTED  NONE DETECTED     Barbiturates NONE DETECTED  NONE DETECTED    URINE MICROSCOPIC-ADD ON     Status: Abnormal   Collection Time   05/27/11  6:06 AM      Component Value Range Comment   Squamous Epithelial / LPF FEW (*) RARE     WBC, UA 0-2  <3 (WBC/hpf)    RBC / HPF 0-2  <3 (RBC/hpf)    Bacteria, UA FEW (*) RARE     Casts HYALINE CASTS (*) NEGATIVE     Urine-Other MUCOUS PRESENT   AMORPHOUS URATES/PHOSPHATES  TYPE AND SCREEN     Status: Normal   Collection Time   05/27/11  6:10 AM      Component Value Range Comment   ABO/RH(D) O POS      Antibody Screen NEG      Sample Expiration 05/30/2011     ABO/RH     Status: Normal   Collection Time   05/27/11  6:10 AM      Component Value Range Comment   ABO/RH(D) O POS     POCT GASTRIC OCCULT BLOOD     Status: Abnormal   Collection Time   05/27/11  6:24 AM      Component Value Range Comment   pH, Gastric NOT DONE      Occult Blood, Gastric POSITIVE (*) NEGATIVE    MRSA PCR SCREENING     Status: Normal   Collection Time   05/27/11  9:14 AM      Component Value Range Comment   MRSA by PCR NEGATIVE  NEGATIVE    INFLUENZA PANEL BY PCR     Status: Normal   Collection Time   05/27/11  9:15 AM      Component Value Range Comment   Influenza A By PCR NEGATIVE  NEGATIVE     Influenza B By PCR NEGATIVE  NEGATIVE     H1N1 flu by pcr NOT DETECTED  NOT DETECTED    URINALYSIS, ROUTINE W REFLEX  MICROSCOPIC     Status: Abnormal   Collection Time   05/27/11  9:15 AM      Component Value Range Comment   Color, Urine YELLOW  YELLOW     APPearance HAZY (*)  CLEAR     Specific Gravity, Urine 1.027  1.005 - 1.030     pH 5.5  5.0 - 8.0     Glucose, UA NEGATIVE  NEGATIVE (mg/dL)    Hgb urine dipstick MODERATE (*) NEGATIVE     Bilirubin Urine NEGATIVE  NEGATIVE     Ketones, ur 15 (*) NEGATIVE (mg/dL)    Protein, ur 30 (*) NEGATIVE (mg/dL)    Urobilinogen, UA 0.2  0.0 - 1.0 (mg/dL)    Nitrite NEGATIVE  NEGATIVE     Leukocytes, UA NEGATIVE  NEGATIVE    URINE MICROSCOPIC-ADD ON     Status: Abnormal   Collection Time   05/27/11  9:15 AM      Component Value Range Comment   Squamous Epithelial / LPF FEW (*) RARE     WBC, UA 0-2  <3 (WBC/hpf)    RBC / HPF 7-10  <3 (RBC/hpf)    Bacteria, UA RARE  RARE     Crystals URIC ACID CRYSTALS (*) NEGATIVE     Urine-Other MUCOUS PRESENT     CARBOXYHEMOGLOBIN     Status: Normal   Collection Time   05/27/11  9:25 AM      Component Value Range Comment   Total hemoglobin 12.8  12.5 - 16.0 (g/dL)    O2 Saturation 16.1      Carboxyhemoglobin 1.1  0.5 - 1.5 (%)    Methemoglobin 0.5  0.0 - 1.5 (%)   LACTIC ACID, PLASMA     Status: Abnormal   Collection Time   05/27/11  9:30 AM      Component Value Range Comment   Lactic Acid, Venous 2.8 (*) 0.5 - 2.2 (mmol/L)   GLUCOSE, CAPILLARY     Status: Abnormal   Collection Time   05/27/11 10:05 AM      Component Value Range Comment   Glucose-Capillary 107 (*) 70 - 99 (mg/dL)   POCT I-STAT 3, BLOOD GAS (G3+)     Status: Abnormal   Collection Time   05/27/11 10:49 AM      Component Value Range Comment   pH, Arterial 7.316 (*) 7.350 - 7.400     pCO2 arterial 43.2  35.0 - 45.0 (mmHg)    pO2, Arterial 80.0  80.0 - 100.0 (mmHg)    Bicarbonate 22.1  20.0 - 24.0 (mEq/L)    TCO2 23  0 - 100 (mmol/L)    O2 Saturation 95.0      Acid-base deficit 4.0 (*) 0.0 - 2.0 (mmol/L)    Patient temperature 37.0 C      Collection site BRACHIAL ARTERY      Drawn by RT      Sample type ARTERIAL     CARDIAC PANEL(CRET KIN+CKTOT+MB+TROPI)     Status: Abnormal   Collection Time   05/27/11  11:00 AM      Component Value Range Comment   Total CK 1122 (*) 7 - 177 (U/L)    CK, MB 11.2 (*) 0.3 - 4.0 (ng/mL)    Troponin I 1.94 (*) <0.30 (ng/mL)    Relative Index 1.0  0.0 - 2.5    FIBRINOGEN     Status: Normal   Collection Time   05/27/11 11:00 AM  Component Value Range Comment   Fibrinogen 392  204 - 475 (mg/dL)   BASIC METABOLIC PANEL     Status: Abnormal   Collection Time   05/27/11 11:00 AM      Component Value Range Comment   Sodium 139  135 - 145 (mEq/L)    Potassium 4.8  3.5 - 5.1 (mEq/L)    Chloride 107  96 - 112 (mEq/L)    CO2 24  19 - 32 (mEq/L)    Glucose, Bld 140 (*) 70 - 99 (mg/dL)    BUN 28 (*) 6 - 23 (mg/dL)    Creatinine, Ser 1.61 (*) 0.50 - 1.10 (mg/dL)    Calcium 7.6 (*) 8.4 - 10.5 (mg/dL)    GFR calc non Af Amer 29 (*) >90 (mL/min)    GFR calc Af Amer 34 (*) >90 (mL/min)   ACETAMINOPHEN LEVEL     Status: Normal   Collection Time   05/27/11 11:00 AM      Component Value Range Comment   Acetaminophen (Tylenol), Serum <15.0  10 - 30 (ug/mL)   CARBOXYHEMOGLOBIN     Status: Abnormal   Collection Time   05/27/11 12:55 PM      Component Value Range Comment   Total hemoglobin 12.2 (*) 12.5 - 16.0 (g/dL)    O2 Saturation 09.6      Carboxyhemoglobin 1.1  0.5 - 1.5 (%)    Methemoglobin 0.6  0.0 - 1.5 (%)     Ct Head Wo Contrast  05/27/2011  *RADIOLOGY REPORT*  Clinical Data: Drug overdose.  Altered mental status.  CT HEAD WITHOUT CONTRAST  Technique:  Contiguous axial images were obtained from the base of the skull through the vertex without contrast.  Comparison: None.  Findings: No intracranial hemorrhage.  No CT evidence of large acute infarct.  Small acute infarct cannot be excluded by CT.  No intracranial mass lesion detected on this unenhanced exam.  No hydrocephalus.  Visualized sinuses and mastoid air cells are clear.  Mild exophthalmos.  IMPRESSION: No intracranial hemorrhage or CT evidence of large acute infarct.  Pooling of secretions  posterior-superior nasopharynx.  Original Report Authenticated By: Fuller Canada, M.D.   Dg Chest Portable 1 View  05/27/2011  *RADIOLOGY REPORT*  Clinical Data: Respiratory distress.  Line placement.  PORTABLE CHEST - 1 VIEW  Comparison: 05/27/2011.  Findings: Left central line tip distal superior vena cava level.  Linear structure projects over the left lung apex appears outside the patient rather than representing a small pneumothorax. Attention to this on follow-up.  Endotracheal tube tip 2.7 cm above the carina.  Consolidation mid to lower lung zones with nodular configuration on the left.  This may represent infectious process however follow-up until clearance recommend to exclude underlying mass.  Nasogastric tube curled within the stomach with the tip at the level of the gastric fundus.  Cardiomegaly.  Pulmonary vascular congestion most notable centrally.  IMPRESSION: Left central line tip distal superior vena cava level.  Linear structure projects over the left lung apex appears outside the patient rather than representing a small pneumothorax. Attention to this on follow-up.  Consolidation mid to lower lung zones with nodular configuration on the left.  This may represent infectious process however follow-up until clearance recommend to exclude underlying mass.  Cardiomegaly.  Pulmonary vascular congestion most notable centrally.  Original Report Authenticated By: Fuller Canada, M.D.   Dg Chest Port 1 View  05/27/2011  *RADIOLOGY REPORT*  Clinical Data: Endotracheal tube placement;  respiratory failure.  PORTABLE CHEST - 1 VIEW  Comparison: None.  Findings: The patient's endotracheal tube is seen ending 2 cm above the carina.  An enteric tube is noted extending below the diaphragm.  The lungs are relatively well expanded.  Fluffy bilateral airspace opacification is noted, with sparing at the lung apices.  This raises concern for multifocal pneumonia.  No pleural effusion or pneumothorax is seen.   The cardiomediastinal silhouette is borderline normal in size.  No acute osseous abnormalities are identified.  IMPRESSION:  1.  Endotracheal tube seen ending 2 cm above the carina. 2.  Fluffy bilateral airspace opacification, concerning for significant multifocal pneumonia.  Original Report Authenticated By: Tonia Ghent, M.D.   Dg Abd Portable 1v  05/27/2011  *RADIOLOGY REPORT*  Clinical Data: Vomiting.  Question ileus.  PORTABLE ABDOMEN - 1 VIEW  Comparison: None  Findings: NG tube coils in the stomach.  Nonobstructive bowel gas pattern.  No supine evidence of free air.  No organomegaly or suspicious calcification.  IMPRESSION: No evidence of obstruction or ileus.  NG tube coils in the stomach.  Original Report Authenticated By: Cyndie Chime, M.D.    Review of Systems  Unable to perform ROS: intubated   Blood pressure 113/53, pulse 89, temperature 100 F (37.8 C), temperature source Core (Comment), resp. rate 12, height 5\' 3"  (1.6 m), weight 89.3 kg (196 lb 13.9 oz), SpO2 100.00%. Physical Exam  Constitutional:       Patient is intubated.  Cardiovascular: Normal rate and regular rhythm.   No murmur heard.      1+ DP pulses.  2+ radials.  Respiratory:       On Vent  GI: Bowel sounds are normal.  Musculoskeletal: She exhibits no edema.  Skin: Skin is warm and dry.    Assessment/Plan: Patient Active Problem List  Diagnoses  . Aspiration pneumonia  . Respiratory failure  . Nausea and vomiting  . Severe sepsis  . Delirium  . AKI (acute kidney injury)   Plan:  2D echo pending.  SEHV to read.  Continue to monitor cardiac enzymes.   HAGER,BRYAN W 05/27/2011, 3:34 PM    Agree with note written by [facility]  Pt admitted with AMS, VDRF ? Secondary to aspiration. No relevant PMH. Recent foot surgery 2 weeks ago in [place]. Labs show slight increase in Trop. EKG NSR with occasional PVCs and NSSTWC.  Pt is intubated, sedated and on multiple pressors per sepsis protocol. 2D pending.  Suspect + troponins from demand ischemia and hypoxemia. Getting SQ hep for VTE prophylaxis. Will follow with you.  Runell Gess 05/27/2011 4:14 PM

## 2011-05-27 NOTE — Progress Notes (Signed)
UR Completed.  Valerie Perry [phone] 04/05/2012  

## 2011-05-27 NOTE — ED Notes (Signed)
CODE SEPSIS initiated

## 2011-05-27 NOTE — Progress Notes (Signed)
CRITICAL VALUE ALERT  Critical value received:  CKMB 11.2, Troponin 1.94  Date of notification:  05/27/11  Time of notification:  1220  Critical value read back:yes  Nurse who received alert:  Royanne Foots  MD notified (1st page):  Dr. Vassie Loll  Time of first page:  1220  MD notified (2nd page):  Time of second page:  Responding MD:  Dr. Vassie Loll  Time MD responded:  [phone]

## 2011-05-27 NOTE — H&P (Signed)
Name: Valerie Perry MRN: [id number] DOB: [birth year]  LOS: 0  CRITICAL CARE ADMISSION NOTE  History of Present Illness: 56 y/o female with no past medical history presented to the ED with aspiration and respiratory failure.  Her husband states that she had foot surgery a week ago and had been doing well post operatively.  She has had multiple procedures in the past and "does not like taking pain meds" and according to him she was taking very little of her pain meds.  On 1/24 she told him that she thought she had caught the flu as she had profound nausea and vomiting all day.  He spoke with her on the phone at 3:00 PM on 1/24.  She appeared fine (but was sleeping) when he checked on her again at 1600 and 2200 on 1/24.  When he checked on her on 1/25 at 0445 she was minimally responsive and had emesis all over her face.  She was brought here and promptly intubated.  Lines / Drains: 1/25 ETT >> 1/25 L subclavian CVL >>  Cultures / Sepsis markers: 1/25 blood >> 1/25 sputum >> 1/25 urine >> 1/25 procalc >> 1/25 flu >>  Antibiotics: 1/25 zosyn >>  Tests / Events: 1/25 head CT pending     No past medical history on file. No past surgical history on file. Prior to Admission medications   Medication Sig Start Date End Date Taking? Authorizing Provider  oxyCODONE (ROXICODONE) 15 MG immediate release tablet Take 15 mg by mouth every 4 (four) hours as needed. For pain   Yes Historical Provider, MD  oxyCODONE-acetaminophen (PERCOCET) 7.5-325 MG per tablet Take 1-2 tablets by mouth every 4 (four) hours as needed. For pain   Yes Historical Provider, MD  promethazine (PHENERGAN) 25 MG tablet Take 25 mg by mouth every 6 (six) hours as needed. For nausea   Yes Historical Provider, MD   Not on File No family history on file. Social History  does not have a smoking history on file. She does not have any smokeless tobacco history on file. Her alcohol and drug histories not on file.  Review Of Systems     Cannot obtain, intubated on vent  Vital Signs:   Filed Vitals:   05/27/11 0628  BP: 148/80  Pulse: 109  Temp: 102.9 F (39.4 C)  Resp: 15    Physical Examination: Gen: sedated on vent HEENT: NCAT, PERRL, EOMi, OP clear, ETT in place Neck: supple without masses PULM: rhonchi bilaterally CV: tachy, no mgr, no JVD AB: BS+, soft, nontender, no hsm Ext: warm, no edema, no clubbing, no cyanosis, s/p surgery L foot, wound c/d/i Derm: no rash or skin breakdown Neuro: sedated on vent Psyche: cannot assess  Labs and Imaging:   CBC    Component Value Date/Time   WBC 10.3 05/27/2011 0600   RBC 4.68 05/27/2011 0600   HGB 14.3 05/27/2011 0600   HCT 45.5 05/27/2011 0600   PLT 378 05/27/2011 0600   MCV 97.2 05/27/2011 0600   MCH 30.6 05/27/2011 0600   MCHC 31.4 05/27/2011 0600   RDW 14.2 05/27/2011 0600   LYMPHSABS 0.9 05/27/2011 0600   MONOABS 0.6 05/27/2011 0600   EOSABS 0.0 05/27/2011 0600   BASOSABS 0.0 05/27/2011 0600    BMET    Component Value Date/Time   NA 140 05/27/2011 0600   K 5.9* 05/27/2011 0600   CL 99 05/27/2011 0600   CO2 26 05/27/2011 0600   GLUCOSE 160* 05/27/2011 0600   BUN  31* 05/27/2011 0600   CREATININE 2.78* 05/27/2011 0600   CALCIUM 9.4 05/27/2011 0600   GFRNONAA 18* 05/27/2011 0600   GFRAA 21* 05/27/2011 0600      Assessment and Plan: 56 y/o female with no past medical history admitted 1/25 for aspiration pneumonia in setting of taking pain meds at home. Her husband does not believe that she overdosed and by counting her pill bottles it appears that she has taken very little of the pain meds prescribed after her recent foot surgery.  It is unclear why she has nausea and vomiting (viral gastroenteritis vs. Flu vs. Less likely obstruction).  She appears profoundly hypoxemic and has severe sepsis by vital signs.   Aspiration pneumonia (05/27/2011)   Assessment: with severe hypoxemia   Plan:  -full vent support (see below) -send sputum and blood cultures -zosyn  monotherapy for now  Respiratory failure (05/27/2011)   Assessment: due to aspiration pnuemonia and delirium   Plan:  -ARDS protocol -pulm toilette -fentanyl/versed for sedation -daily abg/cxr -daily wua and sbt  Nausea and vomiting (05/27/2011)   Assessment: gastroenteritis vs. Ileus vs. sbo?   Plan:  -kub -OG tube to suction  Severe sepsis (05/27/2011)   Assessment: due to    Plan:  -sepsis protocol, EGTD -central line in positioned -antibiotics as above  Delirium (05/27/2011)   Assessment: due to narcotics? Due to hypoxemia?   Plan:  -head CT pending -sedation for ARDS protocol  AKI (acute kidney injury) (05/27/2011)   Assessment: due to severe sepsis vs. Pre renal from n/v   Plan:  -volume resuscitation per sepsis protocol -follow uop -daily bmet  -repeat bmet at 1400 today  Best practices / Disposition: PCCM primary service, ICU level care  Feeding/protein malnutrition: npo Analgesia: fentanyl Sedation: versed Thromboprophylaxis: sub q hep HOB >30 degrees Ulcer prophylaxis: pepcid Glucose control/hyperglycemia: monitor cbg  The patient is critically ill with multiple organ systems failure and requires high complexity decision making for assessment and support, frequent evaluation and titration of therapies, application of advanced monitoring technologies and extensive interpretation of multiple databases. Critical Care Time devoted to patient care services described in this note is 60 minutes.  Heber Robinson, M.D. Pulmonary and Critical Care Medicine [facility] Pager: [phone]  05/27/2011, 7:26 AM

## 2011-05-27 NOTE — ED Notes (Signed)
Ordered dose of Zosyn was 4.5g. ordered stat by Critical Care MD. Pharmacy called to get medication stat. Unable to get medication stat. Therefore two 3.375g of Zosyn were pulled to equal 4.5g of Zosyn. Pt got a total of 4.5g of Zosyn at 06:45

## 2011-05-27 NOTE — ED Provider Notes (Signed)
History     CSN: [id number]  Arrival date & time 05/27/11  0545   First MD Initiated Contact with Patient 05/27/11 0559      Chief Complaint  Patient presents with  . Drug Overdose    (Consider location/radiation/quality/duration/timing/severity/associated sxs/prior treatment) HPI Comments: The patient is a 56 year old female who presents by EMS in respiratory failure after her husband found her in bed this morning with emesis coming out of her nose and around her mouth, with labored and gasping breaths, unresponsive. On EMS arrival, the patient was indeed unresponsive and after 2 doses of Maalox sewn the patient would open her eyes and flail her arms but is not verbally communicative or following commands. On ED arrival the patient has her eyes open, flailing extremities, but is nonverbal and not following commands. She is in obvious in frank respiratory distress with labored breathing, accessory muscle usage, hypoxia of 88-91% on nonrebreather mask, gurgling breath sounds, rhonchi and rales in all fields. There is dried emesis at her nose and mouth. The patient was promptly intubated using rapid sequence intubation to support her ventilation. Her oxygenation improved with this. Speaking to her husband, the patient had surgery on her left foot several days ago and had been taking pain medication, but yesterday it reported that she was not feeling well, with flulike symptoms of nausea, cough, shortness of breath. She had gone to bed with normal mental status and not in any distress. The husband doubts any intentional overdose of medication. The history, review of systems, and physical examination are all impaired by the patient's unstable physical presentation, need for urgent intervention, and intubation with sedation. A level V caveat applies to this encounter.  Patient is a 56 y.o. female presenting with shortness of breath. The history is provided by the EMS personnel. The history is limited by  the condition of the patient (The patient is in frank respiratory failure on arrival prompting immediate intubation, precluding obtaining history, review of systems from the patient, and impairing the physical examination due to her inability to cooperate).  Shortness of Breath  Associated symptoms include shortness of breath and wheezing. Pertinent negatives include no stridor.    No past medical history on file.  No past surgical history on file.  No family history on file.  History  Substance Use Topics  . Smoking status: Not on file  . Smokeless tobacco: Not on file  . Alcohol Use: Not on file    OB History    No data available      Review of Systems  Unable to perform ROS: Intubated  Respiratory: Positive for shortness of breath and wheezing. Negative for stridor.     Allergies  Review of patient's allergies indicates not on file.  Home Medications   Current Outpatient Rx  Name Route Sig Dispense Refill  . OXYCODONE HCL 15 MG PO TABS Oral Take 15 mg by mouth every 4 (four) hours as needed. For pain    . OXYCODONE-ACETAMINOPHEN 7.5-325 MG PO TABS Oral Take 1-2 tablets by mouth every 4 (four) hours as needed. For pain    . PROMETHAZINE HCL 25 MG PO TABS Oral Take 25 mg by mouth every 6 (six) hours as needed. For nausea      BP 162/88  Pulse 113  Temp(Src) 102.5 F (39.2 C) (Rectal)  Resp 15  Ht 5\' 3"  (1.6 m)  Wt 180 lb (81.647 kg)  BMI 31.89 kg/m2  SpO2 95%  Physical Exam  Nursing note and  vitals reviewed. Constitutional: She appears well-developed. She appears distressed.       The patient appears well-nourished and appropriately developed, but is in obvious distress, gasping for air with gurgling breath sounds, with eyes open but unresponsive verbally and not following commands.   HENT:  Head: Normocephalic and atraumatic.  Right Ear: External ear normal.  Left Ear: External ear normal.  Mouth/Throat: No oropharyngeal exudate.       The patient has  dried emesis at the naris bilaterally and around the mouth, but her oropharynx is clear.  Eyes: Conjunctivae are normal. Pupils are equal, round, and reactive to light. Right eye exhibits no discharge. Left eye exhibits no discharge. No scleral icterus.       Pupils 3 mm bilaterally and reactive  Neck: Normal range of motion. Neck supple. No JVD present. No tracheal deviation present. No thyromegaly present.  Cardiovascular: Regular rhythm, S1 normal, S2 normal, normal heart sounds, intact distal pulses and normal pulses.  Tachycardia present.  Exam reveals no gallop and no friction rub.   No murmur heard. Pulmonary/Chest: Accessory muscle usage present. No stridor. Tachypnea noted. She is in respiratory distress. She has decreased breath sounds in the right middle field, the right lower field, the left middle field and the left lower field. She has wheezes in the right upper field, the left upper field and the left middle field. She has rhonchi in the right upper field, the right middle field, the left upper field and the left middle field. She has rales in the right lower field, the left upper field, the left middle field and the left lower field.  Abdominal: Soft. Bowel sounds are normal. She exhibits no distension. There is no tenderness. There is no rebound and no guarding.  Musculoskeletal: She exhibits no edema and no tenderness.       The patient has a postoperative shoe on her left foot, otherwise extremities appear normal, patient moving all extremities  Neurological: No cranial nerve deficit. She exhibits normal muscle tone.       The patient arrives in extremis, with eyes open, flailing extremities, not following commands or responding verbally, in obvious respiratory distress. Her inability to cooperate with the neurologic examination in pairs its sensitivity and accuracy, however there are no apparent focal neurologic deficits.  Skin: Skin is warm and dry. No rash noted. She is not  diaphoretic. No erythema. There is pallor.    ED Course  INTUBATION Date/Time: 05/27/2011 6:29 AM Performed by: Kennon Rounds D Authorized by: Kennon Rounds D Consent: Verbal consent not obtained. Written consent not obtained. The procedure was performed in an emergent situation. Required items: required blood products, implants, devices, and special equipment available Patient identity confirmed: provided demographic data and arm band Time out: Immediately prior to procedure a "time out" was called to verify the correct patient, procedure, equipment, support staff and site/side marked as required. Indications: respiratory distress, respiratory failure, airway protection, hypoxemia and pulmonary toilet Intubation method: video-assisted Patient status: paralyzed (RSI) Preoxygenation: BVM Pretreatment medications: lidocaine Sedatives: etomidate Paralytic: rocuronium Laryngoscope size: Mac 4 Tube size: 7.5 mm Tube type: cuffed Number of attempts: 1 Cricoid pressure: yes Cords visualized: yes Post-procedure assessment: chest rise,  ETCO2 monitor and CO2 detector Breath sounds: equal and absent over the epigastrium Cuff inflated: yes Tube secured with: ETT holder Chest x-ray interpreted by me. Chest x-ray findings: endotracheal tube in appropriate position Patient tolerance: Patient tolerated the procedure well with no immediate complications. Comments: Apparent bilateral pneumonia on  chest x-ray  CRITICAL CARE Performed by: Felisa Bonier   Total critical care time: 45 minutes  Critical care time was exclusive of separately billable procedures and treating other patients.  Critical care was necessary to treat or prevent imminent or life-threatening deterioration.  Critical care was time spent personally by me on the following activities: development of treatment plan with patient and/or surrogate as well as nursing, discussions with consultants, evaluation of patient's  response to treatment, examination of patient, obtaining history from patient or surrogate, ordering and performing treatments and interventions, ordering and review of laboratory studies, ordering and review of radiographic studies, pulse oximetry and re-evaluation of patient's condition.   Date: 05/27/2011  Rate: 103  Rhythm: atrial fibrillation  QRS Axis: normal  Intervals: normal  ST/T Wave abnormalities: nonspecific ST/T changes  Conduction Disutrbances:none  Narrative Interpretation: Abnormal EKG  Old EKG Reviewed: none available    Labs Reviewed  CULTURE, BLOOD (ROUTINE X 2)  CULTURE, BLOOD (ROUTINE X 2)  CBC  DIFFERENTIAL  COMPREHENSIVE METABOLIC PANEL  LACTIC ACID, PLASMA  PROCALCITONIN  URINALYSIS, ROUTINE W REFLEX MICROSCOPIC  URINE CULTURE  BLOOD GAS, ARTERIAL  LIPASE, BLOOD  CULTURE, SPUTUM-ASSESSMENT  TYPE AND SCREEN  TROPONIN I  ETHANOL  URINE RAPID DRUG SCREEN (HOSP PERFORMED)  PROTIME-INR  APTT  OCCULT BLOOD GASTRIC / DUODENUM   No results found.   No diagnosis found.    MDM  Respiratory failure, aspiration pneumonia, healthcare associated pneumonia postoperatively, drug overdose with respiratory failure, respiratory acidosis, secondary cerebral ischemia due to respiratory failure are all entertained in the patient's differential diagnosis amongst other etiologies. The patient has been intubated for airway protection and for oxygenation as well as pulmonary toilet. Empiric antibiotics, vancomycin and Zosyn have been initiated to treat pneumonia. I will obtain a head CT scan to assess for any secondary cerebral ischemia.        Felisa Bonier, MD 05/27/11 [phone]

## 2011-05-27 NOTE — ED Notes (Signed)
Pt has blood in nostrils upon arrival

## 2011-05-27 NOTE — ED Notes (Signed)
Pulled two 3.375 of Zosyn to make a 4.5 dose.

## 2011-05-27 NOTE — ED Notes (Signed)
Attempted to call report nurse unable to take, nurse called off and then called back on.

## 2011-05-27 NOTE — Progress Notes (Signed)
ANTIBIOTIC CONSULT NOTE - INITIAL  Pharmacy Consult for vancomycin + zosyn Indication: rule out pneumonia  Not on File  Patient Measurements: Height: 5\' 3"  (160 cm) Weight: 180 lb (81.647 kg) IBW/kg (Calculated) : 52.4   Vital Signs: Temp: 102.4 F (39.1 C) (01/25 0730) Temp src: Rectal (01/25 0549) BP: 108/67 mmHg (01/25 0730) Pulse Rate: 90  (01/25 0730) Intake/Output from previous day:   Intake/Output from this shift: Total I/O In: 2000 [I.V.:2000] Out: -   Labs:  Basename 05/27/11 0600  WBC 10.3  HGB 14.3  PLT 378  LABCREA --  CREATININE 2.78*   Estimated Creatinine Clearance: 23.7 ml/min (by C-G formula based on Cr of 2.78). No results found for this basename: VANCOTROUGH:2,VANCOPEAK:2,VANCORANDOM:2,GENTTROUGH:2,GENTPEAK:2,GENTRANDOM:2,TOBRATROUGH:2,TOBRAPEAK:2,TOBRARND:2,AMIKACINPEAK:2,AMIKACINTROU:2,AMIKACIN:2, in the last 72 hours   Microbiology: No results found for this or any previous visit (from the past 720 hour(s)).  Medical History: No past medical history on file.  Medications:  Prescriptions prior to admission  Medication Sig Dispense Refill  . oxyCODONE (ROXICODONE) 15 MG immediate release tablet Take 15 mg by mouth every 4 (four) hours as needed. For pain      . oxyCODONE-acetaminophen (PERCOCET) 7.5-325 MG per tablet Take 1-2 tablets by mouth every 4 (four) hours as needed. For pain      . promethazine (PHENERGAN) 25 MG tablet Take 25 mg by mouth every 6 (six) hours as needed. For nausea       Assessment: 53 yof with minimal PMH, presented to the ED with aspiration and respiratory failure. Recent foot surgery and not feeling well recently. Found minimally responsive by her husband. Starting empiric vancomycin + zosyn for possible aspiration pneumonia.   Goal of Therapy:  Vancomycin trough level 15-20 mcg/ml  Plan:  Measure antibiotic drug levels at steady state Follow up culture results Vancomycin 1gm IV Q24H Zosyn 3.375gm IV Q8H (4 hour  infusion)  Effie Janoski, Drake Leach 05/27/2011,9:13 AM

## 2011-05-28 ENCOUNTER — Inpatient Hospital Stay (HOSPITAL_COMMUNITY): Payer: BC Managed Care – PPO

## 2011-05-28 LAB — BLOOD GAS, ARTERIAL
Bicarbonate: 25.4 mEq/L — ABNORMAL HIGH (ref 20.0–24.0)
MECHVT: 420 mL
PEEP: 5 cmH2O
TCO2: 26.7 mmol/L (ref 0–100)
pCO2 arterial: 41.2 mmHg (ref 35.0–45.0)
pH, Arterial: 7.407 — ABNORMAL HIGH (ref 7.350–7.400)

## 2011-05-28 LAB — URINE CULTURE
Colony Count: NO GROWTH
Culture: NO GROWTH
Culture: NO GROWTH

## 2011-05-28 LAB — HEPATIC FUNCTION PANEL
ALT: 401 U/L — ABNORMAL HIGH (ref 0–35)
Albumin: 2.2 g/dL — ABNORMAL LOW (ref 3.5–5.2)
Alkaline Phosphatase: 45 U/L (ref 39–117)
Total Bilirubin: 0.4 mg/dL (ref 0.3–1.2)
Total Protein: 5.1 g/dL — ABNORMAL LOW (ref 6.0–8.3)

## 2011-05-28 LAB — BASIC METABOLIC PANEL
BUN: 22 mg/dL (ref 6–23)
Creatinine, Ser: 1.03 mg/dL (ref 0.50–1.10)
GFR calc Af Amer: 71 mL/min — ABNORMAL LOW (ref >90)
GFR calc non Af Amer: 61 mL/min — ABNORMAL LOW (ref >90)

## 2011-05-28 LAB — CBC
HCT: 34.4 % — ABNORMAL LOW (ref 36.0–46.0)
MCHC: 32.3 g/dL (ref 30.0–36.0)
MCV: 94.5 fL (ref 78.0–100.0)
RDW: 14.1 % (ref 11.5–15.5)

## 2011-05-28 LAB — GLUCOSE, CAPILLARY
Glucose-Capillary: 113 mg/dL — ABNORMAL HIGH (ref 70–99)
Glucose-Capillary: 128 mg/dL — ABNORMAL HIGH (ref 70–99)

## 2011-05-28 LAB — CARDIAC PANEL(CRET KIN+CKTOT+MB+TROPI): Relative Index: 0.9 (ref 0.0–2.5)

## 2011-05-28 LAB — CARBOXYHEMOGLOBIN
Carboxyhemoglobin: 1.4 % (ref 0.5–1.5)
Methemoglobin: 1.4 % (ref 0.0–1.5)
O2 Saturation: 75.1 %

## 2011-05-28 MED ORDER — VANCOMYCIN HCL IN DEXTROSE 1-5 GM/200ML-% IV SOLN
1000.0000 mg | Freq: Two times a day (BID) | INTRAVENOUS | Status: DC
  Administered 2011-05-28: 1000 mg via INTRAVENOUS
  Filled 2011-05-28 (×3): qty 200

## 2011-05-28 MED ORDER — PRO-STAT SUGAR FREE PO LIQD
30.0000 mL | Freq: Two times a day (BID) | ORAL | Status: DC
  Administered 2011-05-28 – 2011-06-01 (×9): 30 mL
  Filled 2011-05-28 (×10): qty 30

## 2011-05-28 MED ORDER — PANTOPRAZOLE SODIUM 40 MG PO PACK
40.0000 mg | PACK | Freq: Every day | ORAL | Status: DC
  Administered 2011-05-28 – 2011-06-01 (×5): 40 mg
  Filled 2011-05-28 (×6): qty 20

## 2011-05-28 MED ORDER — PROMOTE PO LIQD
45.0000 mL/h | ORAL | Status: DC
  Administered 2011-05-28: 25 mL/h
  Administered 2011-05-29 – 2011-05-30 (×2): 45 mL/h
  Filled 2011-05-28 (×5): qty 1000

## 2011-05-28 MED ORDER — PRO-STAT SUGAR FREE PO LIQD: 30.0000 mL | Freq: Three times a day (TID) | ORAL | Status: DC

## 2011-05-28 MED ORDER — JEVITY 1.2 CAL PO LIQD
1000.0000 mL | ORAL | Status: DC
  Administered 2011-05-28: 1000 mL
  Filled 2011-05-28 (×2): qty 1000

## 2011-05-28 MED ORDER — JEVITY 1.2 CAL PO LIQD
1000.0000 mL | ORAL | Status: DC
  Administered 2011-05-28: 1000 mL

## 2011-05-28 NOTE — Progress Notes (Signed)
ANTIBIOTIC CONSULT NOTE - FOLLOW UP  Pharmacy Consult:  Vanc/Zosyn D#2 Indication: PNA + sepsis  Allergies  Allergen Reactions  . Codeine     Upset stomach    Patient Measurements: Height: 5\' 3"  (160 cm) Weight: 196 lb 13.9 oz (89.3 kg) IBW/kg (Calculated) : 52.4   Vital Signs: Temp: 99.3 F (37.4 C) (01/26 1045) BP: 107/63 mmHg (01/26 1045) Pulse Rate: 80  (01/26 1045) Intake/Output from previous day: 01/25 0701 - 01/26 0700 In: 4770.1 [I.V.:4302.6; NG/GT:120; IV Piggyback:347.5] Out: 1285 [Urine:1285] Intake/Output from this shift: Total I/O In: 550.4 [I.V.:325.4; IV Piggyback:225] Out: 75 [Urine:75]  Labs:  [facility] 05/28/11 0416 05/27/11 1100 05/27/11 0600  WBC 9.6 -- 10.3  HGB 11.1* -- 14.3  PLT 199 -- 378  LABCREA -- -- --  CREATININE 1.03 1.88* 2.78*   Estimated Creatinine Clearance: 67 ml/min (by C-G formula based on Cr of 1.03). No results found for this basename: VANCOTROUGH:2,VANCOPEAK:2,VANCORANDOM:2,GENTTROUGH:2,GENTPEAK:2,GENTRANDOM:2,TOBRATROUGH:2,TOBRAPEAK:2,TOBRARND:2,AMIKACINPEAK:2,AMIKACINTROU:2,AMIKACIN:2, in the last 72 hours   Microbiology: Recent Results (from the past 720 hour(s))  URINE CULTURE     Status: Normal   Collection Time   05/27/11  6:06 AM      Component Value Range Status Comment   Specimen Description URINE, CATHETERIZED   Final    Special Requests NONE   Final    Setup Time [id number]   Final    Colony Count NO GROWTH   Final    Culture NO GROWTH   Final    Report Status 05/28/2011 FINAL   Final   MRSA PCR SCREENING     Status: Normal   Collection Time   05/27/11  9:14 AM      Component Value Range Status Comment   MRSA by PCR NEGATIVE  NEGATIVE  Final   CULTURE, RESPIRATORY     Status: Normal (Preliminary result)   Collection Time   05/27/11  9:52 AM      Component Value Range Status Comment   Specimen Description ENDOTRACHEAL   Final    Special Requests Immunocompromised   Final    Gram Stain     Final    Value:  MODERATE WBC PRESENT, PREDOMINANTLY PMN     NO SQUAMOUS EPITHELIAL CELLS SEEN     RARE GRAM POSITIVE COCCI     IN PAIRS   Culture PENDING   Incomplete    Report Status PENDING   Incomplete     Medical History: Past Medical History  Diagnosis Date  . No pertinent past medical history     Medications:  Prescriptions prior to admission  Medication Sig Dispense Refill  . cephALEXin (KEFLEX) 500 MG capsule Take 500 mg by mouth 2 (two) times daily.      Marland Kitchen estradiol (VIVELLE-DOT) 0.0375 MG/24HR Place 1 patch onto the skin 2 (two) times a week.      . nabumetone (RELAFEN) 500 MG tablet Take 500 mg by mouth daily.      Marland Kitchen oxyCODONE (ROXICODONE) 15 MG immediate release tablet Take 15 mg by mouth every 4 (four) hours as needed. For pain      . progesterone (PROMETRIUM) 100 MG capsule Take 100 mg by mouth at bedtime.      . promethazine (PHENERGAN) 25 MG tablet Take 25 mg by mouth every 6 (six) hours as needed. For nausea      . Vitamin D, Ergocalciferol, (DRISDOL) 50000 UNITS CAPS Take 50,000 Units by mouth every 7 (seven) days.       Assessment: 71 YOF with minimal  PMH, presented to the ED with aspiration and respiratory failure. Recent foot surgery and not feeling well recently. Found minimally responsive by her husband. Pharmacy consulted to dose empiric vancomycin + zosyn for possible aspiration pneumonia and sepsis.  Patient admitted with AKI that is resolving.  Goal of Therapy:  Vancomycin trough level 15-20 mcg/ml  Plan:  - Change vanc to 1gm IV Q12H d/t improved renal fxn. - Zosyn 3.375gm IV Q8H (4 hour infusion) - Monitor renal fxn, C/S, vanc trough at steady-state  Phillips Climes, PharmD 05/28/2011,10:59 AM

## 2011-05-28 NOTE — Progress Notes (Addendum)
Name: Valerie Perry MRN: [id number] DOB: 21-Sep-1955  LOS: 1  CRITICAL CARE FU NOTE  History of Present Illness: 56 y/o female with no past medical history presented to the ED with aspiration and respiratory failure.  S/p  foot surgery a week ago.  She has had multiple procedures in the past and "does not like taking pain meds" and according to him she was taking very little of her pain meds.  On 1/24 she told him that she thought she had caught the flu as she had profound nausea and vomiting all day.  He spoke with her on the phone at 3:00 PM on 1/24.  She appeared fine (but was sleeping) when he checked on her again at 1600 and 2200 on 1/24.  When he checked on her on 1/25 at 0445 she was minimally responsive and had emesis all over her face.  She was brought here and promptly intubated.  Lines / Drains: 1/25 ETT >> 1/25 L subclavian CVL >>  Cultures / Sepsis markers: 1/25 blood >> 1/25 sputum >> 1/25 urine >> 1/25 procalc 5 -->>3.9 1/25 flu >>neg  Antibiotics: 1/25 zosyn >> 1/25 vanc >>  Tests / Events: 1/25 head CT neg     Vital Signs:   Filed Vitals:   05/28/11 0700  BP: 101/52  Pulse: 88  Temp: 99.2 F (37.3 C)  Resp: 15    Physical Examination: Gen: sedated on vent HEENT: NCAT, PERRL, EOMi, OP clear, ETT in place Neck: supple without masses PULM: clear bilaterally, no crackles CV: tachy, no mgr, no JVD AB: BS+, soft, nontender, no hsm Ext: warm, no edema, no clubbing, no cyanosis, s/p surgery L foot, wound c/d/i Derm: no rash or skin breakdown Neuro: sedated on vent Psyche: cannot assess  Labs and Imaging:   CBC    Component Value Date/Time   WBC 9.6 05/28/2011 0416   RBC 3.64* 05/28/2011 0416   HGB 11.1* 05/28/2011 0416   HCT 34.4* 05/28/2011 0416   PLT 199 05/28/2011 0416   MCV 94.5 05/28/2011 0416   MCH 30.5 05/28/2011 0416   MCHC 32.3 05/28/2011 0416   RDW 14.1 05/28/2011 0416   LYMPHSABS 0.9 05/27/2011 0600   MONOABS 0.6 05/27/2011 0600   EOSABS 0.0  05/27/2011 0600   BASOSABS 0.0 05/27/2011 0600    BMET    Component Value Date/Time   NA 138 05/28/2011 0416   K 4.2 05/28/2011 0416   CL 105 05/28/2011 0416   CO2 26 05/28/2011 0416   GLUCOSE 187* 05/28/2011 0416   BUN 22 05/28/2011 0416   CREATININE 1.03 05/28/2011 0416   CALCIUM 8.2* 05/28/2011 0416   GFRNONAA 61* 05/28/2011 0416   GFRAA 71* 05/28/2011 0416      Assessment and Plan: 56 y/o female with no past medical history admitted 1/25 for aspiration pneumonia & ARDS in setting of taking pain meds at home. Her husband does not believe that she overdosed and by counting her pill bottles it appears that she has taken very little of the pain meds prescribed after her recent foot surgery.  It is unclear why she has nausea and vomiting (viral gastroenteritis vs. Flu vs. Less likely obstruction).    Aspiration pneumonia (05/27/2011)   Assessment: with severe hypoxemia   Plan:  -full vent support  -Await sputum and blood cultures -zosyn / added vanc  Respiratory failure (05/27/2011)   Assessment: due to aspiration pnuemonia and delirium   Plan:  -ARDS protocol - down to 40%/ +5 -pulm  toilette -fentanyl/versed for sedation -daily wua and sbt  Nausea and vomiting/ Elevated LFTs (05/27/2011)   Assessment: gastroenteritis vs. Ileus vs. sbo?   Plan:  -kub - no ileus -dc LIS , start low dose TFs -tylenol level ok, rpt LFts  Severe sepsis (05/27/2011)   Assessment: due to    Plan:  -sepsis protocol, EGTD -antibiotics as above -lactate clearance good prognostic sign  Delirium (05/27/2011)   Assessment: due to narcotics? Due to hypoxemia?   Plan:  -head CT neg -OK for WUA  Pos trops  -echo, SE cards consulted, may need stress test for risk stratification once acute issues resolved  AKI (acute kidney injury) (05/27/2011)   Assessment: due to severe sepsis vs. Pre renal from n/v   Plan:  -volume resuscitated, cvp target 12 -follow uop  Foot surgery -Podiatrist who performed pt  recent bunion surgery. Office closed due to current local weather conditions. Would like to get further recommendation concerning post-op care.  Plan  -contact Monday morning, [facility] in [place], [place] [phone]   Best practices / Disposition: PCCM primary service, ICU level care, Updated husband at bedside  Feeding/protein malnutrition: TFs Analgesia: fentanyl Sedation: versed Thromboprophylaxis: sub q hep HOB >30 degrees Ulcer prophylaxis: pepcid Glucose control/hyperglycemia: monitor cbg  The patient is critically ill with multiple organ systems failure and requires high complexity decision making for assessment and support, frequent evaluation and titration of therapies, application of advanced monitoring technologies and extensive interpretation of multiple databases. Critical Care Time devoted to patient care services described in this note is 40 minutes.  Cyril Mourning MD. Tonny Bollman. [facility] Pager [phone] If no response call [phone]    05/28/2011, 7:22 AM

## 2011-05-28 NOTE — Progress Notes (Addendum)
INITIAL ADULT NUTRITION ASSESSMENT Date: 05/28/2011   Time: 10:30 AM Reason for Assessment: TF Consult  ASSESSMENT: Female 56 y.o.  Dx: aspiration PNA, respiratory failure, gastroenteritis iv ileus vs sbo, sepsis, delirium, acute kidney injury, elevated cardia enzymes  Past Medical History  Diagnosis Date  . No pertinent past medical history     Scheduled Meds:    . albuterol-ipratropium  4 puff Inhalation Q4H  . antiseptic oral rinse  15 mL Mouth Rinse QID  . chlorhexidine  15 mL Mouth/Throat BID  . heparin  5,000 Units Subcutaneous Q8H  . lidocaine (cardiac) 100 mg/8ml      . naloxone      . pantoprazole sodium  40 mg Per Tube Q1200  . piperacillin-tazobactam (ZOSYN)  IV  3.375 g Intravenous Once  . piperacillin-tazobactam (ZOSYN)  IV  3.375 g Intravenous Q8H  . sodium chloride      . succinylcholine      . vancomycin  1,000 mg Intravenous Q24H  . DISCONTD: antiseptic oral rinse  15 mL Mouth Rinse QID  . DISCONTD: Biotene OralBalance Dry Mouth  15 mL Mouth/Throat QID  . DISCONTD: pantoprazole (PROTONIX) IV  40 mg Intravenous Q24H   Continuous Infusions:    . sodium chloride 100 mL/hr at 05/28/11 1014  . feeding supplement (JEVITY 1.2) 1,000 mL (05/28/11 1019)  . fentaNYL infusion INTRAVENOUS 125 mcg/hr (05/27/11 2345)  . midazolam (VERSED) infusion 3 mg/hr (05/27/11 1506)  . vasopressin (PITRESSIN) infusion - *FOR SHOCK* 0.03 Units/min (05/28/11 0100)  . DISCONTD: feeding supplement (JEVITY 1.2)     PRN Meds:.sodium chloride, acetaminophen, acetaminophen, fentaNYL, midazolam, norepinephrine (LEVOPHED) Adult infusion, sodium chloride, vasopressin (PITRESSIN) infusion - *FOR SHOCK*, DISCONTD: DOBUTamine   Ht: 5\' 3"  (160 cm)  Wt: 196 lb 13.9 oz (89.3 kg)  Ideal Wt: 52.3 kg  % Ideal Wt: 170%  Usual Wt: unable to obtain  Body mass index is 34.87 kg/(m^2).  Food/Nutrition Related Hx:  Nausea and vomiting PTA. Intubated on 05/27/11 and sedated. NG tube curled  within the stomach with the tip at the level of the gastric fundus per radiology. Max. Temperature: 37.4 . Measured Ve: 12.5  Labs:  CMP     Component Value Date/Time   NA 138 05/28/2011 0416   K 4.2 05/28/2011 0416   CL 105 05/28/2011 0416   CO2 26 05/28/2011 0416   GLUCOSE 187* 05/28/2011 0416   BUN 22 05/28/2011 0416   CREATININE 1.03 05/28/2011 0416   CALCIUM 8.2* 05/28/2011 0416   PROT 7.1 05/27/2011 0600   ALBUMIN 3.7 05/27/2011 0600   AST 140* 05/27/2011 0600   ALT 191* 05/27/2011 0600   ALKPHOS 79 05/27/2011 0600   BILITOT 0.4 05/27/2011 0600   GFRNONAA 61* 05/28/2011 0416   GFRAA 71* 05/28/2011 0416    CBG (last 3)   Basename 05/28/11 0403 05/28/11 0023 05/27/11 1951  GLUCAP 164* 128* 178*     Intake/Output Summary (Last 24 hours) at 05/28/11 1030 Last data filed at 05/28/11 0900  Gross per 24 hour  Intake 2721.79 ml  Output   1260 ml  Net 1461.79 ml    Diet Order:  NPO  Supplements/Tube Feeding: Jevity 1.2 to start at 20 mL/hr and increase 10 mL/hr every 4 hours until goal rate of 40 mL, which provides 1152 kcal, 53 gm protein and 774 mL free water daily.  IVF:     sodium chloride Last Rate: 100 mL/hr at 05/28/11 1014  feeding supplement (JEVITY 1.2) Last Rate: 1,000 mL (  05/28/11 1019)  fentaNYL infusion INTRAVENOUS Last Rate: 125 mcg/hr (05/27/11 2345)  midazolam (VERSED) infusion Last Rate: 3 mg/hr (05/27/11 1506)  vasopressin (PITRESSIN) infusion - *FOR SHOCK* Last Rate: 0.03 Units/min (05/28/11 0100)  DISCONTD: feeding supplement (JEVITY 1.2)     Estimated Nutritional Needs (Per ASPEN permissive underfeeding guidelines):   Kcal: 1150-1300 Protein: 100-110 gm Fluid: >2.4 L  NUTRITION DIAGNOSIS: -Inadequate oral intake (NI-2.1).  Status: Ongoing  RELATED TO: intubation and sedation  AS EVIDENCE BY: NPO status  MONITORING/EVALUATION(Goals): Goal: Provide >90% estimated nutritional needs via TF. Monitor: TF adequacy/tolerance, labs, extubation, weight  changes  EDUCATION NEEDS: -Education not appropriate at this time  INTERVENTION: Will change TF to Promote to start at 25 mL/hr and increase 10 mL/hr every 8 hours until goal rate of 45 mL/hr. Add ProStat Sugarfree 64 (30 mL) BID to provide total 1224 kcal, 97.5 gm protein and 906 mL free water daily. If no IVF, recommendation addition of water flushes (250 mL) every 4 hours.   DOCUMENTATION CODES Per approved criteria  -Obesity Unspecified    Valerie Perry 05/28/2011, 10:30 AM

## 2011-05-28 NOTE — Progress Notes (Signed)
Subjective:  Pt is intubated and sedated  Objective:  Temp:  [96.6 F (35.9 C)-102 F (38.9 C)] 99.1 F (37.3 C) (01/26 0845) Pulse Rate:  [75-100] 85  (01/26 0849) Resp:  [11-20] 20  (01/26 0849) BP: (82-137)/(40-76) 116/64 mmHg (01/26 0849) SpO2:  [96 %-100 %] 98 % (01/26 0849) FiO2 (%):  [40 %-100 %] 40 % (01/26 0849) Weight change: 7.652 kg (16 lb 13.9 oz)  Intake/Output from previous day: 01/25 0701 - 01/26 0700 In: 4770.1 [I.V.:4302.6; NG/GT:120; IV Piggyback:347.5] Out: 1285 [Urine:1285]  Intake/Output from this shift: Total I/O In: 154.3 [I.V.:141.8; IV Piggyback:12.5] Out: 75 [Urine:75]  Physical Exam: General appearance: Intubated and sedated Neck: no adenopathy, no carotid bruit, no JVD, supple, symmetrical, trachea midline and thyroid not enlarged, symmetric, no tenderness/mass/nodules Lungs: clear to auscultation bilaterally Heart: regular rate and rhythm, S1, S2 normal, no murmur, click, rub or gallop Extremities: extremities normal, atraumatic, no cyanosis or edema  Lab Results: Results for orders placed during the hospital encounter of 05/27/11 (from the past 48 hour(s))  CBC     Status: Normal   Collection Time   05/27/11  6:00 AM      Component Value Range Comment   WBC 10.3  4.0 - 10.5 (K/uL)    RBC 4.68  3.87 - 5.11 (MIL/uL)    Hemoglobin 14.3  12.0 - 15.0 (g/dL)    HCT 96.0  45.4 - 09.8 (%)    MCV 97.2  78.0 - 100.0 (fL)    MCH 30.6  26.0 - 34.0 (pg)    MCHC 31.4  30.0 - 36.0 (g/dL)    RDW 11.9  14.7 - 82.9 (%)    Platelets 378  150 - 400 (K/uL)   DIFFERENTIAL     Status: Abnormal   Collection Time   05/27/11  6:00 AM      Component Value Range Comment   Neutrophils Relative 85 (*) 43 - 77 (%)    Neutro Abs 8.8 (*) 1.7 - 7.7 (K/uL)    Lymphocytes Relative 9 (*) 12 - 46 (%)    Lymphs Abs 0.9  0.7 - 4.0 (K/uL)    Monocytes Relative 6  3 - 12 (%)    Monocytes Absolute 0.6  0.1 - 1.0 (K/uL)    Eosinophils Relative 0  0 - 5 (%)    Eosinophils  Absolute 0.0  0.0 - 0.7 (K/uL)    Basophils Relative 0  0 - 1 (%)    Basophils Absolute 0.0  0.0 - 0.1 (K/uL)   COMPREHENSIVE METABOLIC PANEL     Status: Abnormal   Collection Time   05/27/11  6:00 AM      Component Value Range Comment   Sodium 140  135 - 145 (mEq/L)    Potassium 5.9 (*) 3.5 - 5.1 (mEq/L)    Chloride 99  96 - 112 (mEq/L)    CO2 26  19 - 32 (mEq/L)    Glucose, Bld 160 (*) 70 - 99 (mg/dL)    BUN 31 (*) 6 - 23 (mg/dL)    Creatinine, Ser 5.62 (*) 0.50 - 1.10 (mg/dL)    Calcium 9.4  8.4 - 10.5 (mg/dL)    Total Protein 7.1  6.0 - 8.3 (g/dL)    Albumin 3.7  3.5 - 5.2 (g/dL)    AST 130 (*) 0 - 37 (U/L)    ALT 191 (*) 0 - 35 (U/L)    Alkaline Phosphatase 79  39 - 117 (U/L)    Total  Bilirubin 0.4  0.3 - 1.2 (mg/dL)    GFR calc non Af Amer 18 (*) >90 (mL/min)    GFR calc Af Amer 21 (*) >90 (mL/min)   LACTIC ACID, PLASMA     Status: Abnormal   Collection Time   05/27/11  6:00 AM      Component Value Range Comment   Lactic Acid, Venous 5.4 (*) 0.5 - 2.2 (mmol/L)   PROCALCITONIN     Status: Normal   Collection Time   05/27/11  6:00 AM      Component Value Range Comment   Procalcitonin 3.96     LIPASE, BLOOD     Status: Abnormal   Collection Time   05/27/11  6:00 AM      Component Value Range Comment   Lipase 152 (*) 11 - 59 (U/L)   TROPONIN I     Status: Abnormal   Collection Time   05/27/11  6:00 AM      Component Value Range Comment   Troponin I 0.53 (*) <0.30 (ng/mL)   ETHANOL     Status: Normal   Collection Time   05/27/11  6:00 AM      Component Value Range Comment   Alcohol, Ethyl (B) <11  0 - 11 (mg/dL)   PROTIME-INR     Status: Abnormal   Collection Time   05/27/11  6:00 AM      Component Value Range Comment   Prothrombin Time 15.6 (*) 11.6 - 15.2 (seconds)    INR 1.21  0.00 - 1.49    APTT     Status: Normal   Collection Time   05/27/11  6:00 AM      Component Value Range Comment   aPTT 27  24 - 37 (seconds)   URINALYSIS, ROUTINE W REFLEX MICROSCOPIC      Status: Abnormal   Collection Time   05/27/11  6:06 AM      Component Value Range Comment   Color, Urine YELLOW  YELLOW     APPearance CLOUDY (*) CLEAR     Specific Gravity, Urine 1.020  1.005 - 1.030     pH 5.0  5.0 - 8.0     Glucose, UA NEGATIVE  NEGATIVE (mg/dL)    Hgb urine dipstick NEGATIVE  NEGATIVE     Bilirubin Urine NEGATIVE  NEGATIVE     Ketones, ur NEGATIVE  NEGATIVE (mg/dL)    Protein, ur 30 (*) NEGATIVE (mg/dL)    Urobilinogen, UA 0.2  0.0 - 1.0 (mg/dL)    Nitrite NEGATIVE  NEGATIVE     Leukocytes, UA NEGATIVE  NEGATIVE    URINE CULTURE     Status: Normal   Collection Time   05/27/11  6:06 AM      Component Value Range Comment   Specimen Description URINE, CATHETERIZED      Special Requests NONE      Setup Time [id number]      Colony Count NO GROWTH      Culture NO GROWTH      Report Status 05/28/2011 FINAL     URINE RAPID DRUG SCREEN (HOSP PERFORMED)     Status: Normal   Collection Time   05/27/11  6:06 AM      Component Value Range Comment   Opiates NONE DETECTED  NONE DETECTED     Cocaine NONE DETECTED  NONE DETECTED     Benzodiazepines NONE DETECTED  NONE DETECTED     Amphetamines NONE DETECTED  NONE DETECTED  Tetrahydrocannabinol NONE DETECTED  NONE DETECTED     Barbiturates NONE DETECTED  NONE DETECTED    URINE MICROSCOPIC-ADD ON     Status: Abnormal   Collection Time   05/27/11  6:06 AM      Component Value Range Comment   Squamous Epithelial / LPF FEW (*) RARE     WBC, UA 0-2  <3 (WBC/hpf)    RBC / HPF 0-2  <3 (RBC/hpf)    Bacteria, UA FEW (*) RARE     Casts HYALINE CASTS (*) NEGATIVE     Urine-Other MUCOUS PRESENT   AMORPHOUS URATES/PHOSPHATES  TYPE AND SCREEN     Status: Normal   Collection Time   05/27/11  6:10 AM      Component Value Range Comment   ABO/RH(D) O POS      Antibody Screen NEG      Sample Expiration 05/30/2011     ABO/RH     Status: Normal   Collection Time   05/27/11  6:10 AM      Component Value Range Comment   ABO/RH(D)  O POS     POCT GASTRIC OCCULT BLOOD     Status: Abnormal   Collection Time   05/27/11  6:24 AM      Component Value Range Comment   pH, Gastric NOT DONE      Occult Blood, Gastric POSITIVE (*) NEGATIVE    MRSA PCR SCREENING     Status: Normal   Collection Time   05/27/11  9:14 AM      Component Value Range Comment   MRSA by PCR NEGATIVE  NEGATIVE    INFLUENZA PANEL BY PCR     Status: Normal   Collection Time   05/27/11  9:15 AM      Component Value Range Comment   Influenza A By PCR NEGATIVE  NEGATIVE     Influenza B By PCR NEGATIVE  NEGATIVE     H1N1 flu by pcr NOT DETECTED  NOT DETECTED    URINALYSIS, ROUTINE W REFLEX MICROSCOPIC     Status: Abnormal   Collection Time   05/27/11  9:15 AM      Component Value Range Comment   Color, Urine YELLOW  YELLOW     APPearance HAZY (*) CLEAR     Specific Gravity, Urine 1.027  1.005 - 1.030     pH 5.5  5.0 - 8.0     Glucose, UA NEGATIVE  NEGATIVE (mg/dL)    Hgb urine dipstick MODERATE (*) NEGATIVE     Bilirubin Urine NEGATIVE  NEGATIVE     Ketones, ur 15 (*) NEGATIVE (mg/dL)    Protein, ur 30 (*) NEGATIVE (mg/dL)    Urobilinogen, UA 0.2  0.0 - 1.0 (mg/dL)    Nitrite NEGATIVE  NEGATIVE     Leukocytes, UA NEGATIVE  NEGATIVE    URINE MICROSCOPIC-ADD ON     Status: Abnormal   Collection Time   05/27/11  9:15 AM      Component Value Range Comment   Squamous Epithelial / LPF FEW (*) RARE     WBC, UA 0-2  <3 (WBC/hpf)    RBC / HPF 7-10  <3 (RBC/hpf)    Bacteria, UA RARE  RARE     Crystals URIC ACID CRYSTALS (*) NEGATIVE     Urine-Other MUCOUS PRESENT     CARBOXYHEMOGLOBIN     Status: Normal   Collection Time   05/27/11  9:25 AM      Component  Value Range Comment   Total hemoglobin 12.8  12.5 - 16.0 (g/dL)    O2 Saturation 16.1      Carboxyhemoglobin 1.1  0.5 - 1.5 (%)    Methemoglobin 0.5  0.0 - 1.5 (%)   LACTIC ACID, PLASMA     Status: Abnormal   Collection Time   05/27/11  9:30 AM      Component Value Range Comment   Lactic Acid,  Venous 2.8 (*) 0.5 - 2.2 (mmol/L)   CULTURE, RESPIRATORY     Status: Normal (Preliminary result)   Collection Time   05/27/11  9:52 AM      Component Value Range Comment   Specimen Description ENDOTRACHEAL      Special Requests Immunocompromised      Gram Stain        Value: MODERATE WBC PRESENT, PREDOMINANTLY PMN     NO SQUAMOUS EPITHELIAL CELLS SEEN     RARE GRAM POSITIVE COCCI     IN PAIRS   Culture PENDING      Report Status PENDING     GLUCOSE, CAPILLARY     Status: Abnormal   Collection Time   05/27/11 10:05 AM      Component Value Range Comment   Glucose-Capillary 107 (*) 70 - 99 (mg/dL)   POCT I-STAT 3, BLOOD GAS (G3+)     Status: Abnormal   Collection Time   05/27/11 10:49 AM      Component Value Range Comment   pH, Arterial 7.316 (*) 7.350 - 7.400     pCO2 arterial 43.2  35.0 - 45.0 (mmHg)    pO2, Arterial 80.0  80.0 - 100.0 (mmHg)    Bicarbonate 22.1  20.0 - 24.0 (mEq/L)    TCO2 23  0 - 100 (mmol/L)    O2 Saturation 95.0      Acid-base deficit 4.0 (*) 0.0 - 2.0 (mmol/L)    Patient temperature 37.0 C      Collection site BRACHIAL ARTERY      Drawn by RT      Sample type ARTERIAL     CORTISOL     Status: Normal   Collection Time   05/27/11 11:00 AM      Component Value Range Comment   Cortisol, Plasma 32.9     CARDIAC PANEL(CRET KIN+CKTOT+MB+TROPI)     Status: Abnormal   Collection Time   05/27/11 11:00 AM      Component Value Range Comment   Total CK 1122 (*) 7 - 177 (U/L)    CK, MB 11.2 (*) 0.3 - 4.0 (ng/mL)    Troponin I 1.94 (*) <0.30 (ng/mL)    Relative Index 1.0  0.0 - 2.5    FIBRINOGEN     Status: Normal   Collection Time   05/27/11 11:00 AM      Component Value Range Comment   Fibrinogen 392  204 - 475 (mg/dL)   BASIC METABOLIC PANEL     Status: Abnormal   Collection Time   05/27/11 11:00 AM      Component Value Range Comment   Sodium 139  135 - 145 (mEq/L)    Potassium 4.8  3.5 - 5.1 (mEq/L)    Chloride 107  96 - 112 (mEq/L)    CO2 24  19 - 32  (mEq/L)    Glucose, Bld 140 (*) 70 - 99 (mg/dL)    BUN 28 (*) 6 - 23 (mg/dL)    Creatinine, Ser 0.96 (*) 0.50 - 1.10 (mg/dL)  Calcium 7.6 (*) 8.4 - 10.5 (mg/dL)    GFR calc non Af Amer 29 (*) >90 (mL/min)    GFR calc Af Amer 34 (*) >90 (mL/min)   ACETAMINOPHEN LEVEL     Status: Normal   Collection Time   05/27/11 11:00 AM      Component Value Range Comment   Acetaminophen (Tylenol), Serum <15.0  10 - 30 (ug/mL)   CARBOXYHEMOGLOBIN     Status: Abnormal   Collection Time   05/27/11 12:55 PM      Component Value Range Comment   Total hemoglobin 12.2 (*) 12.5 - 16.0 (g/dL)    O2 Saturation 40.9      Carboxyhemoglobin 1.1  0.5 - 1.5 (%)    Methemoglobin 0.6  0.0 - 1.5 (%)   GLUCOSE, CAPILLARY     Status: Abnormal   Collection Time   05/27/11  7:51 PM      Component Value Range Comment   Glucose-Capillary 178 (*) 70 - 99 (mg/dL)   CARBOXYHEMOGLOBIN     Status: Abnormal   Collection Time   05/27/11 10:50 PM      Component Value Range Comment   Total hemoglobin 11.7 (*) 12.5 - 16.0 (g/dL)    O2 Saturation 81.1      Carboxyhemoglobin 1.1  0.5 - 1.5 (%)    Methemoglobin 1.0  0.0 - 1.5 (%)   GLUCOSE, CAPILLARY     Status: Abnormal   Collection Time   05/28/11 12:23 AM      Component Value Range Comment   Glucose-Capillary 128 (*) 70 - 99 (mg/dL)   CARBOXYHEMOGLOBIN     Status: Abnormal   Collection Time   05/28/11 12:52 AM      Component Value Range Comment   Total hemoglobin 11.3 (*) 12.5 - 16.0 (g/dL)    O2 Saturation 91.4      Carboxyhemoglobin 1.4  0.5 - 1.5 (%)    Methemoglobin 1.2  0.0 - 1.5 (%)   GLUCOSE, CAPILLARY     Status: Abnormal   Collection Time   05/28/11  4:03 AM      Component Value Range Comment   Glucose-Capillary 164 (*) 70 - 99 (mg/dL)   CBC     Status: Abnormal   Collection Time   05/28/11  4:16 AM      Component Value Range Comment   WBC 9.6  4.0 - 10.5 (K/uL)    RBC 3.64 (*) 3.87 - 5.11 (MIL/uL)    Hemoglobin 11.1 (*) 12.0 - 15.0 (g/dL) DELTA CHECK  NOTED   HCT 34.4 (*) 36.0 - 46.0 (%)    MCV 94.5  78.0 - 100.0 (fL)    MCH 30.5  26.0 - 34.0 (pg)    MCHC 32.3  30.0 - 36.0 (g/dL)    RDW 78.2  95.6 - 21.3 (%)    Platelets 199  150 - 400 (K/uL) DELTA CHECK NOTED  BASIC METABOLIC PANEL     Status: Abnormal   Collection Time   05/28/11  4:16 AM      Component Value Range Comment   Sodium 138  135 - 145 (mEq/L)    Potassium 4.2  3.5 - 5.1 (mEq/L)    Chloride 105  96 - 112 (mEq/L)    CO2 26  19 - 32 (mEq/L)    Glucose, Bld 187 (*) 70 - 99 (mg/dL)    BUN 22  6 - 23 (mg/dL)    Creatinine, Ser 0.86  0.50 - 1.10 (mg/dL)  DELTA CHECK NOTED   Calcium 8.2 (*) 8.4 - 10.5 (mg/dL)    GFR calc non Af Amer 61 (*) >90 (mL/min)    GFR calc Af Amer 71 (*) >90 (mL/min)   BLOOD GAS, ARTERIAL     Status: Abnormal   Collection Time   05/28/11  4:19 AM      Component Value Range Comment   FIO2 0.40      Delivery systems VENTILATOR      Mode PRESSURE REGULATED VOLUME CONTROL      VT 420      Rate 16      Peep/cpap 5.0      pH, Arterial 7.407 (*) 7.350 - 7.400     pCO2 arterial 41.2  35.0 - 45.0 (mmHg)    pO2, Arterial 73.1 (*) 80.0 - 100.0 (mmHg)    Bicarbonate 25.4 (*) 20.0 - 24.0 (mEq/L)    TCO2 26.7  0 - 100 (mmol/L)    Acid-Base Excess 1.2  0.0 - 2.0 (mmol/L)    O2 Saturation 95.6      Patient temperature 98.6      Collection site RIGHT RADIAL      Drawn by [id number]      Sample type ARTERIAL DRAW      Allens test (pass/fail) PASS  PASS    CARBOXYHEMOGLOBIN     Status: Abnormal   Collection Time   05/28/11  4:23 AM      Component Value Range Comment   Total hemoglobin 11.5 (*) 12.5 - 16.0 (g/dL)    O2 Saturation 04.5      Carboxyhemoglobin 1.4  0.5 - 1.5 (%)    Methemoglobin 1.4  0.0 - 1.5 (%)     Imaging: Imaging results have been reviewed  Assessment/Plan:   1. Active Problems: 2.  Aspiration pneumonia 3.  Respiratory failure 4.  Nausea and vomiting 5.  Severe sepsis 6.  Delirium 7.  AKI (acute kidney injury) 8.  Elevated  troponin 9.   Time Spent Directly with Patient:  20 minutes  Length of Stay:  LOS: 1 day    Still intubated om pressors. Sounds like aspiration PNA with sepsis. Trop mildly increased prob secondary to demand ischemia.  2D echo pending. Withdraws to pain. Exam otherwise benign. On ATBX. Will follow with you for now.   Runell Gess 05/28/2011, 9:52 AM

## 2011-05-28 NOTE — Progress Notes (Signed)
  Echocardiogram 2D Echocardiogram has been performed.  Ayanna Gheen, Real Cons 05/28/2011, 1:05 PM

## 2011-05-29 ENCOUNTER — Inpatient Hospital Stay (HOSPITAL_COMMUNITY): Payer: BC Managed Care – PPO

## 2011-05-29 DIAGNOSIS — J690 Pneumonitis due to inhalation of food and vomit: Secondary | ICD-10-CM

## 2011-05-29 DIAGNOSIS — J9600 Acute respiratory failure, unspecified whether with hypoxia or hypercapnia: Secondary | ICD-10-CM

## 2011-05-29 DIAGNOSIS — J984 Other disorders of lung: Secondary | ICD-10-CM

## 2011-05-29 DIAGNOSIS — N179 Acute kidney failure, unspecified: Secondary | ICD-10-CM

## 2011-05-29 LAB — COMPREHENSIVE METABOLIC PANEL
ALT: 494 U/L — ABNORMAL HIGH (ref 0–35)
Alkaline Phosphatase: 48 U/L (ref 39–117)
BUN: 19 mg/dL (ref 6–23)
CO2: 27 mEq/L (ref 19–32)
Chloride: 108 mEq/L (ref 96–112)
Glucose, Bld: 128 mg/dL — ABNORMAL HIGH (ref 70–99)
Potassium: 3.8 mEq/L (ref 3.5–5.1)
Sodium: 141 mEq/L (ref 135–145)
Total Bilirubin: 0.6 mg/dL (ref 0.3–1.2)

## 2011-05-29 LAB — CULTURE, RESPIRATORY W GRAM STAIN

## 2011-05-29 LAB — POCT I-STAT 3, ART BLOOD GAS (G3+)
Acid-Base Excess: 2 mmol/L (ref 0.0–2.0)
Patient temperature: 99.5
TCO2: 27 mmol/L (ref 0–100)
pH, Arterial: 7.423 — ABNORMAL HIGH (ref 7.350–7.400)

## 2011-05-29 LAB — GLUCOSE, CAPILLARY
Glucose-Capillary: 115 mg/dL — ABNORMAL HIGH (ref 70–99)
Glucose-Capillary: 138 mg/dL — ABNORMAL HIGH (ref 70–99)
Glucose-Capillary: 141 mg/dL — ABNORMAL HIGH (ref 70–99)
Glucose-Capillary: 123 mg/dL — ABNORMAL HIGH (ref 70–99)
Glucose-Capillary: 106 mg/dL — ABNORMAL HIGH (ref 70–99)
Glucose-Capillary: 122 mg/dL — ABNORMAL HIGH (ref 70–99)

## 2011-05-29 LAB — CBC
HCT: 29.1 % — ABNORMAL LOW (ref 36.0–46.0)
Hemoglobin: 9.3 g/dL — ABNORMAL LOW (ref 12.0–15.0)
RBC: 3.08 MIL/uL — ABNORMAL LOW (ref 3.87–5.11)

## 2011-05-29 MED ORDER — SODIUM CHLORIDE 0.9 % IJ SOLN
INTRAMUSCULAR | Status: AC
  Filled 2011-05-29: qty 40

## 2011-05-29 NOTE — Progress Notes (Signed)
Subjective:  Intubated, sedated   Objective:  Temp:  [89 F (31.7 C)-101.8 F (38.8 C)] 99.4 F (37.4 C) (01/27 0800) Pulse Rate:  [76-100] 76  (01/27 0800) Resp:  [11-23] 16  (01/27 0800) BP: (89-122)/(54-79) 101/64 mmHg (01/27 0800) SpO2:  [93 %-100 %] 97 % (01/27 0800) FiO2 (%):  [39.6 %-40.5 %] 40 % (01/27 0800) Weight:  [98.4 kg (216 lb 14.9 oz)] 98.4 kg (216 lb 14.9 oz) (01/27 0500) Weight change: 9.1 kg (20 lb 1 oz)  Intake/Output from previous day: 01/26 0701 - 01/27 0700 In: 5251.2 [I.V.:2823.7; NG/GT:645; IV Piggyback:1062.5] Out: 580 [Urine:580]  Intake/Output from this shift: Total I/O In: 145 [I.V.:100; NG/GT:45] Out: -   Physical Exam: General appearance: sedated Neck: no adenopathy, no carotid bruit, no JVD, supple, symmetrical, trachea midline and thyroid not enlarged, symmetric, no tenderness/mass/nodules Lungs: clear to auscultation bilaterally  Lab Results: Results for orders placed during the hospital encounter of 05/27/11 (from the past 48 hour(s))  URINE CULTURE     Status: Normal   Collection Time   05/27/11  9:14 AM      Component Value Range Comment   Specimen Description URINE, CATHETERIZED      Special Requests NONE      Setup Time [id number]      Colony Count NO GROWTH      Culture NO GROWTH      Report Status 05/28/2011 FINAL     MRSA PCR SCREENING     Status: Normal   Collection Time   05/27/11  9:14 AM      Component Value Range Comment   MRSA by PCR NEGATIVE  NEGATIVE    INFLUENZA PANEL BY PCR     Status: Normal   Collection Time   05/27/11  9:15 AM      Component Value Range Comment   Influenza A By PCR NEGATIVE  NEGATIVE     Influenza B By PCR NEGATIVE  NEGATIVE     H1N1 flu by pcr NOT DETECTED  NOT DETECTED    URINALYSIS, ROUTINE W REFLEX MICROSCOPIC     Status: Abnormal   Collection Time   05/27/11  9:15 AM      Component Value Range Comment   Color, Urine YELLOW  YELLOW     APPearance HAZY (*) CLEAR     Specific Gravity,  Urine 1.027  1.005 - 1.030     pH 5.5  5.0 - 8.0     Glucose, UA NEGATIVE  NEGATIVE (mg/dL)    Hgb urine dipstick MODERATE (*) NEGATIVE     Bilirubin Urine NEGATIVE  NEGATIVE     Ketones, ur 15 (*) NEGATIVE (mg/dL)    Protein, ur 30 (*) NEGATIVE (mg/dL)    Urobilinogen, UA 0.2  0.0 - 1.0 (mg/dL)    Nitrite NEGATIVE  NEGATIVE     Leukocytes, UA NEGATIVE  NEGATIVE    URINE MICROSCOPIC-ADD ON     Status: Abnormal   Collection Time   05/27/11  9:15 AM      Component Value Range Comment   Squamous Epithelial / LPF FEW (*) RARE     WBC, UA 0-2  <3 (WBC/hpf)    RBC / HPF 7-10  <3 (RBC/hpf)    Bacteria, UA RARE  RARE     Crystals URIC ACID CRYSTALS (*) NEGATIVE     Urine-Other MUCOUS PRESENT     CARBOXYHEMOGLOBIN     Status: Normal   Collection Time   05/27/11  9:25 AM  Component Value Range Comment   Total hemoglobin 12.8  12.5 - 16.0 (g/dL)    O2 Saturation 96.0      Carboxyhemoglobin 1.1  0.5 - 1.5 (%)    Methemoglobin 0.5  0.0 - 1.5 (%)   LACTIC ACID, PLASMA     Status: Abnormal   Collection Time   05/27/11  9:30 AM      Component Value Range Comment   Lactic Acid, Venous 2.8 (*) 0.5 - 2.2 (mmol/L)   CULTURE, RESPIRATORY     Status: Normal (Preliminary result)   Collection Time   05/27/11  9:52 AM      Component Value Range Comment   Specimen Description ENDOTRACHEAL      Special Requests Immunocompromised      Gram Stain        Value: MODERATE WBC PRESENT, PREDOMINANTLY PMN     NO SQUAMOUS EPITHELIAL CELLS SEEN     RARE GRAM POSITIVE COCCI     IN PAIRS   Culture Culture reincubated for better growth      Report Status PENDING     GLUCOSE, CAPILLARY     Status: Abnormal   Collection Time   05/27/11 10:05 AM      Component Value Range Comment   Glucose-Capillary 107 (*) 70 - 99 (mg/dL)   POCT I-STAT 3, BLOOD GAS (G3+)     Status: Abnormal   Collection Time   05/27/11 10:49 AM      Component Value Range Comment   pH, Arterial 7.316 (*) 7.350 - 7.400     pCO2 arterial  43.2  35.0 - 45.0 (mmHg)    pO2, Arterial 80.0  80.0 - 100.0 (mmHg)    Bicarbonate 22.1  20.0 - 24.0 (mEq/L)    TCO2 23  0 - 100 (mmol/L)    O2 Saturation 95.0      Acid-base deficit 4.0 (*) 0.0 - 2.0 (mmol/L)    Patient temperature 37.0 C      Collection site BRACHIAL ARTERY      Drawn by RT      Sample type ARTERIAL     CORTISOL     Status: Normal   Collection Time   05/27/11 11:00 AM      Component Value Range Comment   Cortisol, Plasma 32.9     CARDIAC PANEL(CRET KIN+CKTOT+MB+TROPI)     Status: Abnormal   Collection Time   05/27/11 11:00 AM      Component Value Range Comment   Total CK 1122 (*) 7 - 177 (U/L)    CK, MB 11.2 (*) 0.3 - 4.0 (ng/mL)    Troponin I 1.94 (*) <0.30 (ng/mL)    Relative Index 1.0  0.0 - 2.5    FIBRINOGEN     Status: Normal   Collection Time   05/27/11 11:00 AM      Component Value Range Comment   Fibrinogen 392  204 - 475 (mg/dL)   BASIC METABOLIC PANEL     Status: Abnormal   Collection Time   05/27/11 11:00 AM      Component Value Range Comment   Sodium 139  135 - 145 (mEq/L)    Potassium 4.8  3.5 - 5.1 (mEq/L)    Chloride 107  96 - 112 (mEq/L)    CO2 24  19 - 32 (mEq/L)    Glucose, Bld 140 (*) 70 - 99 (mg/dL)    BUN 28 (*) 6 - 23 (mg/dL)    Creatinine, Ser 4.54 (*) 0.50 -  1.10 (mg/dL)    Calcium 7.6 (*) 8.4 - 10.5 (mg/dL)    GFR calc non Af Amer 29 (*) >90 (mL/min)    GFR calc Af Amer 34 (*) >90 (mL/min)   ACETAMINOPHEN LEVEL     Status: Normal   Collection Time   05/27/11 11:00 AM      Component Value Range Comment   Acetaminophen (Tylenol), Serum <15.0  10 - 30 (ug/mL)   CARBOXYHEMOGLOBIN     Status: Abnormal   Collection Time   05/27/11 12:55 PM      Component Value Range Comment   Total hemoglobin 12.2 (*) 12.5 - 16.0 (g/dL)    O2 Saturation 16.1      Carboxyhemoglobin 1.1  0.5 - 1.5 (%)    Methemoglobin 0.6  0.0 - 1.5 (%)   GLUCOSE, CAPILLARY     Status: Abnormal   Collection Time   05/27/11  7:51 PM      Component Value Range  Comment   Glucose-Capillary 178 (*) 70 - 99 (mg/dL)   CARBOXYHEMOGLOBIN     Status: Abnormal   Collection Time   05/27/11 10:50 PM      Component Value Range Comment   Total hemoglobin 11.7 (*) 12.5 - 16.0 (g/dL)    O2 Saturation 09.6      Carboxyhemoglobin 1.1  0.5 - 1.5 (%)    Methemoglobin 1.0  0.0 - 1.5 (%)   GLUCOSE, CAPILLARY     Status: Abnormal   Collection Time   05/28/11 12:23 AM      Component Value Range Comment   Glucose-Capillary 128 (*) 70 - 99 (mg/dL)   CARBOXYHEMOGLOBIN     Status: Abnormal   Collection Time   05/28/11 12:52 AM      Component Value Range Comment   Total hemoglobin 11.3 (*) 12.5 - 16.0 (g/dL)    O2 Saturation 04.5      Carboxyhemoglobin 1.4  0.5 - 1.5 (%)    Methemoglobin 1.2  0.0 - 1.5 (%)   GLUCOSE, CAPILLARY     Status: Abnormal   Collection Time   05/28/11  4:03 AM      Component Value Range Comment   Glucose-Capillary 164 (*) 70 - 99 (mg/dL)   CBC     Status: Abnormal   Collection Time   05/28/11  4:16 AM      Component Value Range Comment   WBC 9.6  4.0 - 10.5 (K/uL)    RBC 3.64 (*) 3.87 - 5.11 (MIL/uL)    Hemoglobin 11.1 (*) 12.0 - 15.0 (g/dL) DELTA CHECK NOTED   HCT 34.4 (*) 36.0 - 46.0 (%)    MCV 94.5  78.0 - 100.0 (fL)    MCH 30.5  26.0 - 34.0 (pg)    MCHC 32.3  30.0 - 36.0 (g/dL)    RDW 40.9  81.1 - 91.4 (%)    Platelets 199  150 - 400 (K/uL) DELTA CHECK NOTED  BASIC METABOLIC PANEL     Status: Abnormal   Collection Time   05/28/11  4:16 AM      Component Value Range Comment   Sodium 138  135 - 145 (mEq/L)    Potassium 4.2  3.5 - 5.1 (mEq/L)    Chloride 105  96 - 112 (mEq/L)    CO2 26  19 - 32 (mEq/L)    Glucose, Bld 187 (*) 70 - 99 (mg/dL)    BUN 22  6 - 23 (mg/dL)    Creatinine, Ser 7.82  0.50 - 1.10 (mg/dL) DELTA CHECK NOTED   Calcium 8.2 (*) 8.4 - 10.5 (mg/dL)    GFR calc non Af Amer 61 (*) >90 (mL/min)    GFR calc Af Amer 71 (*) >90 (mL/min)   BLOOD GAS, ARTERIAL     Status: Abnormal   Collection Time   05/28/11  4:19  AM      Component Value Range Comment   FIO2 0.40      Delivery systems VENTILATOR      Mode PRESSURE REGULATED VOLUME CONTROL      VT 420      Rate 16      Peep/cpap 5.0      pH, Arterial 7.407 (*) 7.350 - 7.400     pCO2 arterial 41.2  35.0 - 45.0 (mmHg)    pO2, Arterial 73.1 (*) 80.0 - 100.0 (mmHg)    Bicarbonate 25.4 (*) 20.0 - 24.0 (mEq/L)    TCO2 26.7  0 - 100 (mmol/L)    Acid-Base Excess 1.2  0.0 - 2.0 (mmol/L)    O2 Saturation 95.6      Patient temperature 98.6      Collection site RIGHT RADIAL      Drawn by [id number]      Sample type ARTERIAL DRAW      Allens test (pass/fail) PASS  PASS    CARBOXYHEMOGLOBIN     Status: Abnormal   Collection Time   05/28/11  4:23 AM      Component Value Range Comment   Total hemoglobin 11.5 (*) 12.5 - 16.0 (g/dL)    O2 Saturation 62.1      Carboxyhemoglobin 1.4  0.5 - 1.5 (%)    Methemoglobin 1.4  0.0 - 1.5 (%)   HEPATIC FUNCTION PANEL     Status: Abnormal   Collection Time   05/28/11  2:00 PM      Component Value Range Comment   Total Protein 5.1 (*) 6.0 - 8.3 (g/dL)    Albumin 2.2 (*) 3.5 - 5.2 (g/dL)    AST 308 (*) 0 - 37 (U/L)    ALT 401 (*) 0 - 35 (U/L)    Alkaline Phosphatase 45  39 - 117 (U/L)    Total Bilirubin 0.4  0.3 - 1.2 (mg/dL)    Bilirubin, Direct 0.1  0.0 - 0.3 (mg/dL)    Indirect Bilirubin 0.3  0.3 - 0.9 (mg/dL)   CARDIAC PANEL(CRET KIN+CKTOT+MB+TROPI)     Status: Abnormal   Collection Time   05/28/11  2:00 PM      Component Value Range Comment   Total CK 445 (*) 7 - 177 (U/L)    CK, MB 4.1 (*) 0.3 - 4.0 (ng/mL)    Troponin I 1.03 (*) <0.30 (ng/mL)    Relative Index 0.9  0.0 - 2.5    GLUCOSE, CAPILLARY     Status: Abnormal   Collection Time   05/28/11  4:25 PM      Component Value Range Comment   Glucose-Capillary 113 (*) 70 - 99 (mg/dL)   GLUCOSE, CAPILLARY     Status: Abnormal   Collection Time   05/28/11 11:37 PM      Component Value Range Comment   Glucose-Capillary 138 (*) 70 - 99 (mg/dL)   CBC      Status: Abnormal   Collection Time   05/29/11  4:30 AM      Component Value Range Comment   WBC 8.2  4.0 - 10.5 (K/uL)    RBC 3.08 (*)  3.87 - 5.11 (MIL/uL)    Hemoglobin 9.3 (*) 12.0 - 15.0 (g/dL)    HCT 16.1 (*) 09.6 - 46.0 (%)    MCV 94.5  78.0 - 100.0 (fL)    MCH 30.2  26.0 - 34.0 (pg)    MCHC 32.0  30.0 - 36.0 (g/dL)    RDW 04.5  40.9 - 81.1 (%)    Platelets 163  150 - 400 (K/uL)   COMPREHENSIVE METABOLIC PANEL     Status: Abnormal   Collection Time   05/29/11  4:30 AM      Component Value Range Comment   Sodium 141  135 - 145 (mEq/L)    Potassium 3.8  3.5 - 5.1 (mEq/L)    Chloride 108  96 - 112 (mEq/L)    CO2 27  19 - 32 (mEq/L)    Glucose, Bld 128 (*) 70 - 99 (mg/dL)    BUN 19  6 - 23 (mg/dL)    Creatinine, Ser 9.14  0.50 - 1.10 (mg/dL)    Calcium 8.0 (*) 8.4 - 10.5 (mg/dL)    Total Protein 5.1 (*) 6.0 - 8.3 (g/dL)    Albumin 2.0 (*) 3.5 - 5.2 (g/dL)    AST 782 (*) 0 - 37 (U/L)    ALT 494 (*) 0 - 35 (U/L)    Alkaline Phosphatase 48  39 - 117 (U/L)    Total Bilirubin 0.6  0.3 - 1.2 (mg/dL)    GFR calc non Af Amer >90  >90 (mL/min)    GFR calc Af Amer >90  >90 (mL/min)   GLUCOSE, CAPILLARY     Status: Abnormal   Collection Time   05/29/11  4:43 AM      Component Value Range Comment   Glucose-Capillary 115 (*) 70 - 99 (mg/dL)   GLUCOSE, CAPILLARY     Status: Abnormal   Collection Time   05/29/11  7:45 AM      Component Value Range Comment   Glucose-Capillary 141 (*) 70 - 99 (mg/dL)     Imaging: Imaging results have been reviewed  Assessment/Plan:   1. Active Problems: 2.  Aspiration pneumonia 3.  Respiratory failure 4.  Nausea and vomiting 5.  Severe sepsis 6.  Delirium 7.  AKI (acute kidney injury) 8.  Elevated troponin 9.   Time Spent Directly with Patient:  20 minutes  Length of Stay:  LOS: 2 days   Still intubated for VDRF prob caused by aspiration with septic shock. Pressors have been D/Cd. Good Sats. Sedation D/Cd. 2D echo nl. Doubt anything  cardiovascular contributing. Will S/O. Call if we can be of further assistance.  Runell Gess 05/29/2011, 8:40 AM

## 2011-05-29 NOTE — Progress Notes (Signed)
Name: Valerie Perry MRN: [id number] DOB: 13-Dec-1955    LOS: 2  PCCM Progress NOTE  History of Present Illness: 56 y/o female with history of recent nausea and vomiting and "not feeling well" in setting of taking pain meds, found minimally responsive  per husband report, admitted to ICU with profound hypoxemia and subsequent elevation of cardiac enzymes.  Subjective: febrile overnight, no acute events on telemetry  Lines / Drains: 1/25 ETT >>  1/25 L subclavian CVL >>  Cultures: 1/25 blood >> ng 1/25 sputum >>  1/25 urine >>  1/25 procalc  5 -->> 3.9 1/25 flu >> neg   Antibiotics: 1/25 zosyn >>  1/25 vanc >>  Tests / Events: 1/25 head CT neg 1/25 Abd U/S  neg 1/25 CXR consolidation left lung apex with pulm vascular congestion 1/26 CXR slight improvement in bibasilar opacities c/w improving pneumonia 1/27 CXR no improvement in bibasilar opacities  Vital Signs: Temp:  [89 F (31.7 C)-101.8 F (38.8 C)] 99.4 F (37.4 C) (01/27 0800) Pulse Rate:  [76-100] 76  (01/27 0800) Resp:  [11-23] 16  (01/27 0800) BP: (89-122)/(54-79) 101/64 mmHg (01/27 0800) SpO2:  [93 %-100 %] 97 % (01/27 0800) FiO2 (%):  [39.6 %-40.5 %] 40 % (01/27 0800) Weight:  [216 lb 14.9 oz (98.4 kg)] 216 lb 14.9 oz (98.4 kg) (01/27 0500) I/O last 3 completed shifts: In: 6578.3 [I.V.:4075.8; Other:720; NG/GT:645; IV Piggyback:1137.5] Out: 1205 [Urine:1205]  Physical Examination: Gen: sedated on vent, husband sleeping at bedside HEENT: NCAT, ETT in place  Neck: supple without masses  PULM: CTAB CV: RRR, no mgr appreciated, no JVD appreciated AB: BS+, soft, nontender, no hsm  Ext: warm, trace edema, no clubbing, no cyanosis, s/p surgery L foot Neuro: arousable but very lethargic, attempts to follow commands  Ventilator settings: Vent Mode:  [-] CPAP FiO2 (%):  [39.6 %-40.5 %] 40 % Set Rate:  [16 bmp] 16 bmp Vt Set:  [420 mL] 420 mL PEEP:  [5 cmH20] 5 cmH20 Pressure Support:  [8 cmH20-10 cmH20] 10  cmH20 Plateau Pressure:  [14 cmH20-16 cmH20] 15 cmH20  Labs and Imaging:  Reviewed.  Please refer to the Assessment and Plan section for relevant results. Basic Metabolic Panel:  Basename 05/29/11 0430 05/28/11 0416  NA 141 138  K 3.8 4.2  CL 108 105  CO2 27 26  GLUCOSE 128* 187*  BUN 19 22  CREATININE 0.73 1.03  CALCIUM 8.0* 8.2*  MG -- --  PHOS -- --   Liver Function Tests:  Basename 05/29/11 0430 05/28/11 1400  AST 187* 201*  ALT 494* 401*  ALKPHOS 48 45  BILITOT 0.6 0.4  PROT 5.1* 5.1*  ALBUMIN 2.0* 2.2*    Basename 05/27/11 0600  LIPASE 152*  AMYLASE --   CBC:  Basename 05/29/11 0430 05/28/11 0416 05/27/11 0600  WBC 8.2 9.6 --  NEUTROABS -- -- 8.8*  HGB 9.3* 11.1* --  HCT 29.1* 34.4* --  MCV 94.5 94.5 --  PLT 163 199 --   Cardiac Enzymes:  Basename 05/28/11 1400 05/27/11 1100 05/27/11 0600  CKTOTAL 445* 1122* --  CKMB 4.1* 11.2* --  CKMBINDEX -- -- --  TROPONINI 1.03* 1.94* 0.53*   CBG:  Basename 05/29/11 0745 05/29/11 0443 05/28/11 2337 05/28/11 1625 05/28/11 0403 05/28/11 0023  GLUCAP 141* 115* 138* 113* 164* 128*   Coagulation:  Basename 05/27/11 0600  LABPROT 15.6*  INR 1.21   Urine Drug Screen: Drugs of Abuse     Component Value Date/Time  LABOPIA NONE DETECTED 05/27/2011 0606   COCAINSCRNUR NONE DETECTED 05/27/2011 0606   LABBENZ NONE DETECTED 05/27/2011 0606   AMPHETMU NONE DETECTED 05/27/2011 0606   THCU NONE DETECTED 05/27/2011 0606   LABBARB NONE DETECTED 05/27/2011 0606    Alcohol Level:  Basename 05/27/11 0600  ETH <11     Assessment and Plan: 56 y/o female with no past medical history admitted 1/25 for aspiration pneumonia with respiratory distress and severe sepsis with shock.  Resp 1. Aspiration Pneumonia:  febrile with Tmax 101.8 responsive to Tylenol, improving clinically on Day #3 Vancomycin and Zosyn, CXR stable Plan -continue zosyn for 7 day course -d/c vancomycin given no MRSA on sputum or BCX  -continue  Tylenol with decrease frequency given increased LFTs  2. Respiratory failure, hypoxic: improving, likely secondary to aspiration pneumonia associated with vomiting and delirium. PaO2/FiO2 ~183, pt arousable, hemodynamically stable on PEEP of 5 Plan -ARDS protocol - -Weaning well on Ps/ CPAP but hold off extubation due to mental status & secretions -pulm toilette  -will decrease sedation -daily wua and sbt  Lab 05/28/11 0423 05/28/11 0419 05/28/11 0052 05/27/11 2250 05/27/11 1255 05/27/11 1049  PHART -- 7.407* -- -- -- 7.316*  PCO2ART -- 41.2 -- -- -- 43.2  PO2ART -- 73.1* -- -- -- 80.0  HCO3 -- 25.4* -- -- -- 22.1  TCO2 -- 26.7 -- -- -- 23  O2SAT 73.5 95.6 75.1 83.3 85.6 --   ID 3. SIRS/sepsis, severe: maintaining bp off pressors, lactate trended towards normal, BCX without growth to date, no leukocytosis since admission, KUB without obstruction or ileus Plan -sepsis protocol, EGTD  -continue Zosyn Day #3 of 7  Lab 05/29/11 0430 05/28/11 0416 05/27/11 0600  WBC 8.2 9.6 10.3   Neuro 4. Delirium: unclear etiology, possibly narcortic or hypoxemia, pt without response to Narcan in ED, husband reports pt does not like to take pain medications, Acetaminophen level low, CT of head without acute findings Plan -Continue WUA -haldol prn rather than versed  CV 5. Elevated Cardiac Enzymes: EKG with 1mm ST depression in lateral leads, likely secondary to demand ischemia, ECHO without acute findings, Cardiology consulted for further recommendations Plan -appreciate Cardiology recommendations, no further wu planned    Lab 05/28/11 1400 05/27/11 1100 05/27/11 0600  TROPONINI 1.03* 1.94* 0.53*   Renal 6. Acute Kidney Injury:  Likely due to severe sepsis vs pre-renal from nausea and vomiting. Volume resuscitated, creatinine normal. Low UOP ~24cc/h Plan -NS to [facility] -follow uop -continue daily BMET -target cvp 4-8   Lab 05/29/11 0430 05/28/11 0416 05/27/11 1100 05/27/11 0600  CREATININE  0.73 1.03 1.88* 2.78*    Intake/Output Summary (Last 24 hours) at 05/29/11 0920 Last data filed at 05/29/11 0800  Gross per 24 hour  Intake 4947.6 ml  Output    625 ml  Net 4322.6 ml   GI 7. Nausea and Vomiting/ transaminitis: KUB w/o acute findings, Tube Feeds started, LFTs worsening, ischemic hepatitis possible -continue to follow Hepatic function -will change Tylenol from q4h to q8h prn fever  Lab 05/29/11 0430 05/28/11 1400 05/27/11 0600  AST 187* 201* 140*  ALT 494* 401* 191*  ALKPHOS 48 45 79  BILITOT 0.6 0.4 0.4  PROT 5.1* 5.1* 7.1  ALBUMIN 2.0* 2.2* 3.7  INR -- -- 1.21    8 Disposition:  recent bunion surgery. POdiatrist  Office closed due to local weather conditions on Friday and weekend status.  Would like to get further recommendation concerning post-op care.   Plan -  contact Monday morning, [facility] in [place], [place] [phone]   Kristie Cowman 05/29/2011, 9:20 AM  Updated husband in detail; Care during the described time interval was provided by me and/or other providers on the critical care team.  I have reviewed this patient's available data, including medical history, events of note, physical examination and test results as part of my evaluation  CC time x 7m  ALVA,RAKESH V.

## 2011-05-29 NOTE — Research (Signed)
Paitent a screen fail for SAILS secondary to elevated AT and ALT. Also screen fail for MIND secondary to QTc greater than 500.

## 2011-05-30 ENCOUNTER — Inpatient Hospital Stay (HOSPITAL_COMMUNITY): Payer: BC Managed Care – PPO

## 2011-05-30 DIAGNOSIS — J9600 Acute respiratory failure, unspecified whether with hypoxia or hypercapnia: Secondary | ICD-10-CM

## 2011-05-30 DIAGNOSIS — Z9911 Dependence on respirator [ventilator] status: Secondary | ICD-10-CM

## 2011-05-30 DIAGNOSIS — J690 Pneumonitis due to inhalation of food and vomit: Secondary | ICD-10-CM

## 2011-05-30 DIAGNOSIS — J984 Other disorders of lung: Secondary | ICD-10-CM

## 2011-05-30 LAB — BASIC METABOLIC PANEL
CO2: 30 mEq/L (ref 19–32)
Calcium: 8.3 mg/dL — ABNORMAL LOW (ref 8.4–10.5)
GFR calc non Af Amer: >90 mL/min (ref >90)
Glucose, Bld: 116 mg/dL — ABNORMAL HIGH (ref 70–99)
Potassium: 3.7 mEq/L (ref 3.5–5.1)
Sodium: 143 mEq/L (ref 135–145)

## 2011-05-30 LAB — GLUCOSE, CAPILLARY
Glucose-Capillary: 113 mg/dL — ABNORMAL HIGH (ref 70–99)
Glucose-Capillary: 135 mg/dL — ABNORMAL HIGH (ref 70–99)
Glucose-Capillary: 132 mg/dL — ABNORMAL HIGH (ref 70–99)
Glucose-Capillary: 113 mg/dL — ABNORMAL HIGH (ref 70–99)

## 2011-05-30 LAB — HEPATIC FUNCTION PANEL
Albumin: 1.8 g/dL — ABNORMAL LOW (ref 3.5–5.2)
Indirect Bilirubin: 0.2 mg/dL — ABNORMAL LOW (ref 0.3–0.9)
Total Protein: 5.2 g/dL — ABNORMAL LOW (ref 6.0–8.3)

## 2011-05-30 LAB — CBC
Hemoglobin: 8.9 g/dL — ABNORMAL LOW (ref 12.0–15.0)
MCH: 30.1 pg (ref 26.0–34.0)
Platelets: 186 10*3/uL (ref 150–400)
RBC: 2.96 MIL/uL — ABNORMAL LOW (ref 3.87–5.11)
WBC: 11.6 10*3/uL — ABNORMAL HIGH (ref 4.0–10.5)

## 2011-05-30 MED ORDER — PROPOFOL 10 MG/ML IV EMUL
5.0000 ug/kg/min | INTRAVENOUS | Status: DC
  Administered 2011-05-30 (×2): 20 ug/kg/min via INTRAVENOUS
  Administered 2011-05-31 (×3): 25 ug/kg/min via INTRAVENOUS
  Administered 2011-06-01: 30 ug/kg/min via INTRAVENOUS
  Administered 2011-06-01: 20 ug/kg/min via INTRAVENOUS
  Filled 2011-05-30 (×9): qty 100

## 2011-05-30 MED ORDER — IPRATROPIUM-ALBUTEROL 18-103 MCG/ACT IN AERO
4.0000 | INHALATION_SPRAY | Freq: Four times a day (QID) | RESPIRATORY_TRACT | Status: DC
  Administered 2011-05-30 – 2011-06-02 (×11): 4 via RESPIRATORY_TRACT

## 2011-05-30 NOTE — Progress Notes (Signed)
Name: Valerie Perry MRN: [id number] DOB: May 07, 1955    LOS: 3  PCCM Progress NOTE  History of Present Illness: 56 y/o female with history of recent nausea and vomiting and "not feeling well" in setting of taking pain meds, found minimally responsive  per husband report, admitted to ICU with profound hypoxemia and subsequent elevation of cardiac enzymes.  Subjective: febrile, no acute events on telemetry, thick secretions, agitation with decreased sedation and oxygen desaturations to 87-88% when turning patient per nursing reports  Lines / Drains: 1/25 ETT >>  1/25 L subclavian CVL >>  Cultures: 1/25 blood >> ng 1/25 sputum >> pending 1/25 resp gm stain>> rare gm + cocci in prs 1/25 urine >> ng 1/25 procalc  5 -->> 3.9 1/25 flu >> neg   Antibiotics: 1/25 zosyn >>  1/25 vanc >>1/27  Tests / Events: 1/25 head CT neg 1/25 Abd U/S  neg 1/25 CXR consolidation left lung apex with pulm vascular congestion 1/26 CXR slight improvement in bibasilar opacities c/w improving pneumonia 1/27 CXR no improvement in bibasilar opacities 1/28 CXR Persistent multilobar interstitial & airspace opacities, c/w multilobar pneumonia  Vital Signs: Temp:  [98.8 F (37.1 C)-101.8 F (38.8 C)] 101.7 F (38.7 C) (01/28 0700) Pulse Rate:  [81-112] 93  (01/28 0700) Resp:  [16-33] 24  (01/28 0700) BP: (99-145)/(55-84) 117/68 mmHg (01/28 0700) SpO2:  [87 %-100 %] 89 % (01/28 0700) FiO2 (%):  [39.6 %-80.8 %] 39.7 % (01/28 0700) I/O last 3 completed shifts: In: 5183.6 [I.V.:3333.6; Other:60; NG/GT:1365; IV Piggyback:425] Out: 1690 [Urine:1690]  Physical Examination: Gen: sedated on vent, husband sleeping at bedside HEENT: NCAT, ETT in place  Neck: supple without masses  PULM: good movement of air CV: RRR, no mgr appreciated, no JVD appreciated AB: BS+, soft, nontender, no hsm  Ext: warm, trace edema, no clubbing, no cyanosis, s/p surgery L foot Neuro: sedated  Ventilator settings: Vent Mode:  [-]  PRVC FiO2 (%):  [39.6 %-80.8 %] 39.7 % Set Rate:  [16 bmp] 16 bmp Vt Set:  [420 mL] 420 mL PEEP:  [5 cmH20] 5 cmH20 Pressure Support:  [5 cmH20-8 cmH20] 8 cmH20 Plateau Pressure:  [5 cmH20-18 cmH20] 18 cmH20  Labs and Imaging:   Basic Metabolic Panel:  Basename 05/30/11 0500 05/29/11 0430  NA 143 141  K 3.7 3.8  CL 109 108  CO2 30 27  GLUCOSE 116* 128*  BUN 19 19  CREATININE 0.72 0.73  CALCIUM 8.3* 8.0*  MG -- --  PHOS -- --   Liver Function Tests:  Basename 05/29/11 0430 05/28/11 1400  AST 187* 201*  ALT 494* 401*  ALKPHOS 48 45  BILITOT 0.6 0.4  PROT 5.1* 5.1*  ALBUMIN 2.0* 2.2*   CBC:  Basename 05/30/11 0500 05/29/11 0430  WBC 11.6* 8.2  NEUTROABS -- --  HGB 8.9* 9.3*  HCT 27.6* 29.1*  MCV 93.2 94.5  PLT 186 163   Cardiac Enzymes:  Basename 05/28/11 1400 05/27/11 1100  CKTOTAL 445* 1122*  CKMB 4.1* 11.2*  CKMBINDEX -- --  TROPONINI 1.03* 1.94*   CBG:  Basename 05/30/11 0342 05/30/11 0002 05/29/11 1951 05/29/11 1225 05/29/11 0745 05/29/11 0443  GLUCAP 113* 113* 122* 106* 141* 115*    Urine Drug Screen: Drugs of Abuse     Component Value Date/Time   LABOPIA NONE DETECTED 05/27/2011 0606   COCAINSCRNUR NONE DETECTED 05/27/2011 0606   LABBENZ NONE DETECTED 05/27/2011 0606   AMPHETMU NONE DETECTED 05/27/2011 0606   THCU NONE DETECTED 05/27/2011  0606   LABBARB NONE DETECTED 05/27/2011 0606      Assessment and Plan: HD# 4 for 56 y/o female with past medical history of recent bunion surgery, nausea, vomiting admitted for aspiration pneumonia with respiratory distress and severe sepsis with shock.  Respiratory 1. Aspiration Pneumonia:  febrile with ~Tmax 102 responsive to Tylenol, on Day #4 of Zosyn, Vancomycin dc yesterday due to no MRSA, CXR stable with persistent multilobar opacities, thick secretions Plan -continue zosyn for 7 day course -continue to monitor CXR and respiratory status -continue Tylenol   2. Respiratory failure, hypoxic: improved  and stable, likely secondary to aspiration pneumonia associated with vomiting and delirium. PaO2/FiO2 ~197, pt arousable to painful stimuli, hemodynamically stable on PRVC, RR 16, PEEP of 5, plateau 18, FiO2 0.4, weaning well on pressure support/ CPAP but gets agitated and has thick secretions Plan -continue mechanical ventilation - - hold off extubation due to mental status & secretions -start Pressure Control on 60%, RR 12, Peep 5 -pulm toilette  -d/c sedation protocol, Fentanyl fixed, Propofol with RASS -1 to -2 -daily wua and sbt  Lab 05/29/11 1124 05/28/11 0423 05/28/11 0419 05/28/11 0052 05/27/11 2250 05/27/11 1049  PHART 7.423* -- 7.407* -- -- 7.316*  PCO2ART 40.0 -- 41.2 -- -- 43.2  PO2ART 79.0* -- 73.1* -- -- 80.0  HCO3 26.0* -- 25.4* -- -- 22.1  TCO2 27 -- 26.7 -- -- 23  O2SAT 95.0 73.5 95.6 75.1 83.3 --    Infectious Disease 3. SIRS/sepsis, severe: maintaining bp off pressors, lactate trended towards normal, BCX without growth to date, mild leukocytosis today, KUB without obstruction or ileus, Urine negative Plan -sepsis protocol, EGTD  -continue Zosyn Day #4 of 7  Lab 05/30/11 0500 05/29/11 0430 05/28/11 0416 05/27/11 0600  WBC 11.6* 8.2 9.6 10.3     Renal 4. Acute Kidney Injury:  Likely due to severe sepsis vs pre-renal from nausea and vomiting. Volume resuscitated, creatinine normal. UOP improved Plan -NS to [facility] -follow uop -continue daily BMET -target cvp 4-8  Lab 05/30/11 0500 05/29/11 0430 05/28/11 0416 05/27/11 1100 05/27/11 0600  CREATININE 0.72 0.73 1.03 1.88* 2.78*    Intake/Output Summary (Last 24 hours) at 05/30/11 0821 Last data filed at 05/30/11 0700  Gross per 24 hour  Intake 2929.55 ml  Output   1065 ml  Net 1864.55 ml    Neurology 5. Delirium: unclear etiology, possibly narcortic or hypoxemia, pt without response to Narcan in ED, husband reports pt does not like to take pain medications, Acetaminophen level low, CT of head without  acute findings Plan -Continue WUA -haldol prn rather than versed   Cardiovascular 6. Elevated Cardiac Enzymes: EKG with 1mm ST depression in lateral leads, likely secondary to demand ischemia, ECHO without acute findings, Cardiology consulted for further recommendations Plan -appreciate Cardiology recommendations, no further wu planned     Lab 05/28/11 1400 05/27/11 1100 05/27/11 0600  TROPONINI 1.03* 1.94* 0.53*     GI 7. Nausea and Vomiting/ transaminitis: KUB w/o acute findings, Tube Feeds started, LFTs worsening, ischemic hepatitis possible -continue to follow Hepatic function -continue Tylenol from q8h prn fever  Lab 05/29/11 0430 05/28/11 1400 05/27/11 0600  AST 187* 201* 140*  ALT 494* 401* 191*  ALKPHOS 48 45 79  BILITOT 0.6 0.4 0.4  PROT 5.1* 5.1* 7.1  ALBUMIN 2.0* 2.2* 3.7  INR -- -- 1.21    8.  Disposition:  recent bunion surgery. Seen in podiatrist office 05/13/11, due for dressing change 1/25 per  podiatry office.  Spoke with Dr. Gae Bon of [facility] who recommends dressing change prn and colleague Dr. Colman Cater to evaluate patient this week.   Plan -Dr. Vito/ Dr. Colman Cater of [facility] in [place], [place] [phone] -wound care for dressing change to left foot   SCHOOLER, KAREN 05/30/2011, 8:21 AM   Pt seen and examined and database reviewed. I agree with above findings, assessment and plan. Not quite ready for extubation due to high O2 reqts. Vent changes made. Daily WUA/SBT. Hopefully extubate 1/29  Billy Fischer, MD;  PCCM service; Mobile [phone]

## 2011-05-30 NOTE — Progress Notes (Signed)
UR Completed.  Alzada Brazee Jane [phone] 05/30/2011  

## 2011-05-31 ENCOUNTER — Inpatient Hospital Stay (HOSPITAL_COMMUNITY): Payer: BC Managed Care – PPO

## 2011-05-31 LAB — CBC
Platelets: 192 10*3/uL (ref 150–400)
RBC: 3.01 MIL/uL — ABNORMAL LOW (ref 3.87–5.11)
RDW: 14.8 % (ref 11.5–15.5)
WBC: 12.2 10*3/uL — ABNORMAL HIGH (ref 4.0–10.5)

## 2011-05-31 LAB — BASIC METABOLIC PANEL
BUN: 24 mg/dL — ABNORMAL HIGH (ref 6–23)
CO2: 31 mEq/L (ref 19–32)
Chloride: 110 mEq/L (ref 96–112)
Chloride: 105 mEq/L (ref 96–112)
Creatinine, Ser: 0.72 mg/dL (ref 0.50–1.10)
GFR calc Af Amer: >90 mL/min (ref >90)
Glucose, Bld: 109 mg/dL — ABNORMAL HIGH (ref 70–99)
Potassium: 3.7 mEq/L (ref 3.5–5.1)
Potassium: 3.8 mEq/L (ref 3.5–5.1)
Sodium: 146 mEq/L — ABNORMAL HIGH (ref 135–145)

## 2011-05-31 LAB — POCT I-STAT 3, ART BLOOD GAS (G3+)
Bicarbonate: 27.7 mEq/L — ABNORMAL HIGH (ref 20.0–24.0)
Patient temperature: 101.7
pCO2 arterial: 44.5 mmHg (ref 35.0–45.0)
pH, Arterial: 7.409 — ABNORMAL HIGH (ref 7.350–7.400)
pO2, Arterial: 66 mmHg — ABNORMAL LOW (ref 80.0–100.0)

## 2011-05-31 LAB — GLUCOSE, CAPILLARY
Glucose-Capillary: 110 mg/dL — ABNORMAL HIGH (ref 70–99)
Glucose-Capillary: 120 mg/dL — ABNORMAL HIGH (ref 70–99)

## 2011-05-31 LAB — HEPATIC FUNCTION PANEL
Albumin: 1.9 g/dL — ABNORMAL LOW (ref 3.5–5.2)
Total Bilirubin: 0.5 mg/dL (ref 0.3–1.2)

## 2011-05-31 LAB — PROCALCITONIN: Procalcitonin: 2.28 ng/mL

## 2011-05-31 LAB — LACTIC ACID, PLASMA: Lactic Acid, Venous: 1.3 mmol/L (ref 0.5–2.2)

## 2011-05-31 MED ORDER — SODIUM CHLORIDE 0.9 % IJ SOLN
INTRAMUSCULAR | Status: AC
  Administered 2011-05-31: 08:00:00
  Filled 2011-05-31: qty 20

## 2011-05-31 MED ORDER — SODIUM CHLORIDE 0.9 % IJ SOLN
INTRAMUSCULAR | Status: AC
  Administered 2011-05-31: 20 mL
  Filled 2011-05-31: qty 20

## 2011-05-31 MED ORDER — FREE WATER
200.0000 mL | Freq: Three times a day (TID) | Status: DC
  Administered 2011-05-31 – 2011-06-02 (×6): 200 mL

## 2011-05-31 MED ORDER — FUROSEMIDE 10 MG/ML IJ SOLN: 40.0000 mg | Freq: Once | INTRAMUSCULAR | Status: AC

## 2011-05-31 MED ORDER — CIPROFLOXACIN IN D5W 400 MG/200ML IV SOLN
400.0000 mg | Freq: Two times a day (BID) | INTRAVENOUS | Status: DC
  Administered 2011-05-31 – 2011-06-03 (×6): 400 mg via INTRAVENOUS
  Filled 2011-05-31 (×7): qty 200

## 2011-05-31 MED ORDER — OXEPA PO LIQD
30.0000 mL/h | ORAL | Status: DC
  Filled 2011-05-31 (×9): qty 237

## 2011-05-31 MED ORDER — FUROSEMIDE 10 MG/ML IJ SOLN
INTRAMUSCULAR | Status: AC
  Administered 2011-05-31: 40 mg
  Filled 2011-05-31: qty 4

## 2011-05-31 MED ORDER — ACETAMINOPHEN 160 MG/5ML PO SOLN
650.0000 mg | Freq: Three times a day (TID) | ORAL | Status: DC | PRN
  Administered 2011-05-31: 650 mg via ORAL
  Filled 2011-05-31: qty 20.3

## 2011-05-31 MED ORDER — POTASSIUM CHLORIDE 20 MEQ/15ML (10%) PO LIQD
40.0000 meq | Freq: Every day | ORAL | Status: DC
  Administered 2011-05-31 – 2011-06-01 (×2): 40 meq
  Filled 2011-05-31 (×2): qty 30

## 2011-05-31 MED ORDER — VANCOMYCIN HCL IN DEXTROSE 1-5 GM/200ML-% IV SOLN
1000.0000 mg | Freq: Three times a day (TID) | INTRAVENOUS | Status: DC
  Administered 2011-05-31 – 2011-06-03 (×9): 1000 mg via INTRAVENOUS
  Filled 2011-05-31 (×11): qty 200

## 2011-05-31 MED ORDER — ACETAMINOPHEN 325 MG PO TABS: 650.0000 mg | ORAL_TABLET | Freq: Three times a day (TID) | ORAL | Status: DC | PRN

## 2011-05-31 NOTE — Progress Notes (Addendum)
ANTIBIOTIC CONSULT NOTE - FOLLOW UP  Pharmacy Consult for zosyn Indication: pneumonia  Vital Signs: Temp: 100.7 F (38.2 C) (01/29 0600) BP: 119/61 mmHg (01/29 0600) Pulse Rate: 81  (01/29 0600) Intake/Output from previous day: 01/28 0701 - 01/29 0700 In: 2851.4 [I.V.:1476.4; NG/GT:1200; IV Piggyback:175] Out: 1090 [Urine:1090]  Labs:  [facility] 05/31/11 0509 05/30/11 0500 05/29/11 0430  WBC 12.2* 11.6* 8.2  HGB 9.1* 8.9* 9.3*  PLT 192 186 163  LABCREA -- -- --  CREATININE 0.72 0.72 0.73   Estimated Creatinine Clearance: 90.6 ml/min (by C-G formula based on Cr of 0.72). No results found for this basename: VANCOTROUGH:2,VANCOPEAK:2,VANCORANDOM:2,GENTTROUGH:2,GENTPEAK:2,GENTRANDOM:2,TOBRATROUGH:2,TOBRAPEAK:2,TOBRARND:2,AMIKACINPEAK:2,AMIKACINTROU:2,AMIKACIN:2, in the last 72 hours   Microbiology: Recent Results (from the past 720 hour(s))  URINE CULTURE     Status: Normal   Collection Time   05/27/11  6:06 AM      Component Value Range Status Comment   Specimen Description URINE, CATHETERIZED   Final    Special Requests NONE   Final    Culture  Setup Time [id number]   Final    Colony Count NO GROWTH   Final    Culture NO GROWTH   Final    Report Status 05/28/2011 FINAL   Final   CULTURE, BLOOD (ROUTINE X 2)     Status: Normal (Preliminary result)   Collection Time   05/27/11  6:10 AM      Component Value Range Status Comment   Specimen Description BLOOD RIGHT ARM   Final    Special Requests BOTTLES DRAWN AEROBIC ONLY [facility]   Final    Culture  Setup Time [id number]   Final    Culture     Final    Value:        BLOOD CULTURE RECEIVED NO GROWTH TO DATE CULTURE WILL BE HELD FOR 5 DAYS BEFORE ISSUING A FINAL NEGATIVE REPORT   Report Status PENDING   Incomplete   CULTURE, BLOOD (ROUTINE X 2)     Status: Normal (Preliminary result)   Collection Time   05/27/11  6:30 AM      Component Value Range Status Comment   Specimen Description BLOOD LEFT ARM   Final    Special  Requests BOTTLES DRAWN AEROBIC AND ANAEROBIC 5CC   Final    Culture  Setup Time [id number]   Final    Culture     Final    Value:        BLOOD CULTURE RECEIVED NO GROWTH TO DATE CULTURE WILL BE HELD FOR 5 DAYS BEFORE ISSUING A FINAL NEGATIVE REPORT   Report Status PENDING   Incomplete   URINE CULTURE     Status: Normal   Collection Time   05/27/11  9:14 AM      Component Value Range Status Comment   Specimen Description URINE, CATHETERIZED   Final    Special Requests NONE   Final    Culture  Setup Time [id number]   Final    Colony Count NO GROWTH   Final    Culture NO GROWTH   Final    Report Status 05/28/2011 FINAL   Final   MRSA PCR SCREENING     Status: Normal   Collection Time   05/27/11  9:14 AM      Component Value Range Status Comment   MRSA by PCR NEGATIVE  NEGATIVE  Final   CULTURE, RESPIRATORY     Status: Normal   Collection Time   05/27/11  9:52 AM  Component Value Range Status Comment   Specimen Description ENDOTRACHEAL   Final    Special Requests Immunocompromised   Final    Gram Stain     Final    Value: MODERATE WBC PRESENT, PREDOMINANTLY PMN     NO SQUAMOUS EPITHELIAL CELLS SEEN     RARE GRAM POSITIVE COCCI     IN PAIRS   Culture Non-Pathogenic Oropharyngeal-type Flora Isolated.   Final    Report Status 05/29/2011 FINAL   Final     Anti-infectives     Start     Dose/Rate Route Frequency Ordered Stop   05/28/11 2200   vancomycin (VANCOCIN) IVPB 1000 mg/200 mL premix  Status:  Discontinued        1,000 mg 200 mL/hr over 60 Minutes Intravenous Every 12 hours 05/28/11 1101 05/29/11 0900   05/27/11 1500  piperacillin-tazobactam (ZOSYN) IVPB 3.375 g       3.375 g 12.5 mL/hr over 240 Minutes Intravenous Every 8 hours 05/27/11 0904     05/27/11 1000   vancomycin (VANCOCIN) IVPB 1000 mg/200 mL premix  Status:  Discontinued        1,000 mg 200 mL/hr over 60 Minutes Intravenous Every 24 hours 05/27/11 0904 05/28/11 1101   05/27/11 0900    piperacillin-tazobactam (ZOSYN) IVPB 3.375 g  Status:  Discontinued        3.375 g 100 mL/hr over 30 Minutes Intravenous  Once 05/27/11 0848 05/27/11 0858   05/27/11 0645   piperacillin-tazobactam (ZOSYN) IVPB 4.5 g  Status:  Discontinued        4.5 g 134 mL/hr over 30 Minutes Intravenous  Once 05/27/11 0636 05/27/11 0637   05/27/11 0645  piperacillin-tazobactam (ZOSYN) IVPB 3.375 g       3.375 g 12.5 mL/hr over 240 Minutes Intravenous  Once 05/27/11 0642 05/27/11 1045   05/27/11 0615   piperacillin-tazobactam (ZOSYN) IVPB 4.5 g  Status:  Discontinued        4.5 g 200 mL/hr over 30 Minutes Intravenous  Once 05/27/11 0603 05/27/11 0722   05/27/11 0615   vancomycin (VANCOCIN) 1,000 mg in sodium chloride 0.9 % 250 mL IVPB  Status:  Discontinued        1,000 mg 250 mL/hr over 60 Minutes Intravenous  Once 05/27/11 0603 05/27/11 0609   05/27/11 0615   vancomycin (VANCOCIN) IVPB 1000 mg/200 mL premix  Status:  Discontinued        1,000 mg 200 mL/hr over 60 Minutes Intravenous  Once 05/27/11 0609 05/27/11 0644          Assessment: 55 yof on empiric zosyn for possible aspiration PNA. Today is day #5 of planned 7 days of therapy. Dose is appropriate. Pt continued to be febrile with an elevated WBC. Respiratory culture reported with non-pathogrenic oral flora, blood cxs NGTD, urine cx NG x 2, and MRSA PCR negative.   Plan:  Expected duration 7 days with resolution of temperature and/or normalization of WBC Follow up culture results Continue zosyn 3.375gm IV Q8H F/u clinical course and possible other causes of fever  Quintyn Dombek, Drake Leach 05/31/2011,8:03 AM   Addendum: Changing zosyn to cipro + vancomycin d/t continued fevers. Start vancomycin 1gm IV Q8H and cipro 400mg  IV Q12H  Lysle Pearl, PharmD, BCPS Pager # [phone] 05/31/2011 10:48 AM

## 2011-05-31 NOTE — Progress Notes (Signed)
Name: Valerie Perry MRN: [id number] DOB: 11-08-1955    LOS: 4  PCCM Progress NOTE   Subjective: 56 y/o female with past medical history of recent bunion surgery, nausea, vomiting admitted 1/25 for aspiration pneumonia with respiratory distress and severe sepsis with shock.  Continues to be Febrile, Tmx 102.8 overnight, thick secretions on suction, quickly desats O2 to low 80s with RR 40s on bathing and turning, much agitation on decreased sedation, maintained on Propofol 20 mcg/kg/min overnight and Fentanyl 100 mcg/h, no acute events on telemetry  Lines / Drains: 1/25 ETT >>  1/25 L subclavian CVL >>  Cultures: 1/25 blood >> ng 1/25 sputum >> pending 1/25 resp gm stain>> rare gm + cocci in prs 1/25 urine >> ng 1/25 procalc  5 -->> 3.9--> 2.28 (1/29) 1/25 lactic acid 5.4--> 2.8 --> 1.3 (1/29) 1/25 flu >> neg 1/29 blood >> 1/29 Urine >> 1/29 Resp >>   Antibiotics: Zosyn 1/25 >> 1/29 Vanc 1/25 >>1/27, 1/29 >> Cipro 1/29 >>   Tests / Events: 1/25 head CT neg 1/25 Abd U/S  neg 1/25 CXR consolidation left lung apex with pulm vascular congestion 1/26 CXR slight improvement in bibasilar opacities c/w improving pneumonia 1/27 CXR no improvement in bibasilar opacities 1/28 CXR Persistent multilobar interstitial & airspace opacities, c/w multilobar pneumonia 1/29 CXR Significantly worsening bilateral diffuse lung opacities  1/29 XR left foot >>> No definite bony destruction is identified. There is no soft tissue gas.   Vital Signs: Temp:  [97.7 F (36.5 C)-102.8 F (39.3 C)] 100.5 F (38.1 C) (01/29 1145) Pulse Rate:  [66-146] 79  (01/29 1145) Resp:  [19-39] 25  (01/29 1145) BP: (99-144)/(55-103) 124/73 mmHg (01/29 1130) SpO2:  [86 %-100 %] 98 % (01/29 1145) FiO2 (%):  [39.7 %-99.3 %] 40 % (01/29 1145) Weight:  [216 lb 0.8 oz (98 kg)] 216 lb 0.8 oz (98 kg) (01/29 0454) I/O last 3 completed shifts: In: 3960.9 [I.V.:2045.9; Other:60; NG/GT:1605; IV Piggyback:250] Out: 1770  [Urine:1770]  Physical Examination: Gen: NAD. Sedated and intubated HEENT: NCAT  Neck: supple without masses  PULM: good movement of air CV: RRR, no mgr appreciated, no JVD appreciated AB: BS+, soft, nontender, no hsm  Ext: warm, trace edema, no clubbing, no cyanosis, s/p surgery L foot Neuro: RASS -2.   Ventilator settings: Vent Mode:  [-] PCV FiO2 (%):  [39.7 %-99.3 %] 40 % Set Rate:  [12 bmp] 12 bmp PEEP:  [4.9 cmH20-5.2 cmH20] 5 cmH20 Plateau Pressure:  [19 cmH20] 19 cmH20  Labs and Imaging:   Basic Metabolic Panel:  Basename 05/31/11 0509 05/30/11 0500  NA 146* 143  K 3.7 3.7  CL 110 109  CO2 31 30  GLUCOSE 125* 116*  BUN 23 19  CREATININE 0.72 0.72  CALCIUM 8.8 8.3*  MG -- --  PHOS -- --   Liver Function Tests:  Basename 05/31/11 0509 05/30/11 0500  AST 46* 84*  ALT 254* 359*  ALKPHOS 83 61  BILITOT 0.5 0.5  PROT 5.6* 5.2*  ALBUMIN 1.9* 1.8*   CBC:  Basename 05/31/11 0509 05/30/11 0500  WBC 12.2* 11.6*  NEUTROABS -- --  HGB 9.1* 8.9*  HCT 28.2* 27.6*  MCV 93.7 93.2  PLT 192 186   Cardiac Enzymes:  Basename 05/28/11 1400  CKTOTAL 445*  CKMB 4.1*  CKMBINDEX --  TROPONINI 1.03*   CBG:  Basename 05/31/11 0435 05/31/11 0016 05/30/11 1947 05/30/11 1151 05/30/11 0816 05/30/11 0342  GLUCAP 120* 110* 113* 132* 135* 113*  Assessment and Plan: HD# 5 for 56 y/o female with recent bunion surgery, nausea, vomiting admitted for aspiration pneumonia with respiratory distress and severe sepsis with shock.  Respiratory 1. Aspiration Pneumonia:  febrile with ~Tmax 102.8, on Day #5 of Zosyn, CXR with worsening bilateral alveolar opacities c/w edema, likely in need of diuresis to extent bp and renal function can tolerate with target neg 1L  Plan- -start Lasix 40 mg IV, will supplement with K 40 mEq -change Zosyn to Cipro -resume Vancomycin -continue to monitor CXR and respiratory status -continue Tylenol   2. Respiratory failure, hypoxic: improved  and stable, likely secondary to aspiration pneumonia associated with vomiting and delirium.  Pt arousable to painful stimuli, hemodynamically stable on Pressure Control on 60%, RR 12, Peep 5  Plan -continue mechanical ventilation - -Pressure Control, will increase to 15 -cont pulm toilette  -Fentanyl fixed, Propofol with RASS -1 to -2 -daily wua and sbt  Lab 05/30/11 0955 05/29/11 1124 05/28/11 0423 05/28/11 0419 05/28/11 0052 05/27/11 1049  PHART 7.409* 7.423* -- 7.407* -- 7.316*  PCO2ART 44.5 40.0 -- 41.2 -- 43.2  PO2ART 66.0* 79.0* -- 73.1* -- 80.0  HCO3 27.7* 26.0* -- 25.4* -- 22.1  TCO2 29 27 -- 26.7 -- 23  O2SAT 91.0 95.0 73.5 95.6 75.1 --    Infectious Disease 3. SIRS/sepsis, severe: maintaining bp off pressors, lactate trending towards normal, BCX without growth to date, increasing leukocytosis, Pro-calcitonin still elevated at 2.28, lactic acid 1.3, KUB without obstruction or ileus, Urine negative, on Hep for DVT prophlx PLAN- -sepsis protocol, EGTD  -will repeat pan-cx -d/c Zosyn -resume Vancomycin -add Ciprofloxacin 400 mg IV q12h  Lab 05/31/11 0509 05/30/11 0500 05/29/11 0430 05/28/11 0416 05/27/11 0600  WBC 12.2* 11.6* 8.2 9.6 10.3    4. S/p left bunion surgery: pt with implanted wired in left foot left foot incision site c/d/i w/o signs of infection. Seen in podiatrist office 05/13/11, due for dressing change 1/25 per podiatry office.  Spoke with Dr. Gae Bon of [facility] who recommends dressing change prn and colleague Dr. Colman Cater to evaluate patient this week PLAN- -XR left foot r/o osteomyelitis  Renal 5. Acute Kidney Injury:  Likely due to severe sepsis vs pre-renal from nausea and vomiting. Volume resuscitated, creatinine normal. UOP improved Plan -NS to [facility] -follow uop -continue daily BMET -will give free water bolus 200cc q8h  Lab 05/31/11 0509 05/30/11 0500 05/29/11 0430 05/28/11 0416 05/27/11 1100  CREATININE 0.72 0.72 0.73 1.03 1.88*     Intake/Output Summary (Last 24 hours) at 05/31/11 1156 Last data filed at 05/31/11 1100  Gross per 24 hour  Intake 2980.97 ml  Output    995 ml  Net 1985.97 ml    Neurology 6. Delirium: unclear etiology, possibly narcortic or hypoxemia, pt without response to Narcan in ED, husband reports pt does not like to take pain medications, Acetaminophen level low, CT of head without acute findings Plan -Continue WUA -haldol prn rather than versed   Cardiovascular 7. Elevated Cardiac Enzymes: EKG with 1mm ST depression in lateral leads, likely secondary to demand ischemia, ECHO without acute findings, Cardiology consulted for further recommendations Plan -appreciate Cardiology recommendations, no further wu planned    Lab 05/28/11 1400 05/27/11 1100 05/27/11 0600  TROPONINI 1.03* 1.94* 0.53*     Gastroenterology 8. Nausea and Vomiting/ transaminitis: KUB w/o acute findings, Tube Feeds started, LFTs trending towards normal, ischemic hepatitis possible -continue to follow Hepatic function -continue Tylenol from q8h prn fever  Lab 05/31/11 0509 05/30/11 0500 05/29/11 0430 05/28/11 1400 05/27/11 0600  AST 46* 84* 187* 201* 140*  ALT 254* 359* 494* 401* 191*  ALKPHOS 83 61 48 45 79  BILITOT 0.5 0.5 0.6 0.4 0.4  PROT 5.6* 5.2* 5.1* 5.1* 7.1  ALBUMIN 1.9* 1.8* 2.0* 2.2* 3.7  INR -- -- -- -- 1.21    9. Nutrition: currently on tube feeds Pro-sate and Promote, target caloric needs 1200 -start Oxepa 300cc/h -continue Pro-stat -stop Promote  10.  Disposition: Full code, husband and family at bedside, informed of pt status and plans of care       SCHOOLER, KAREN 05/31/2011, 11:56 AM  Pt seen and examined and database reviewed. I agree with above findings, assessment and plan. Family updated @ bedside. 35 mins CCM time  Billy Fischer, MD;  PCCM service; Mobile [phone]

## 2011-06-01 ENCOUNTER — Inpatient Hospital Stay (HOSPITAL_COMMUNITY): Payer: BC Managed Care – PPO

## 2011-06-01 LAB — CBC
HCT: 28.7 % — ABNORMAL LOW (ref 36.0–46.0)
MCHC: 32.4 g/dL (ref 30.0–36.0)
MCV: 93.2 fL (ref 78.0–100.0)
RDW: 14.7 % (ref 11.5–15.5)

## 2011-06-01 LAB — BASIC METABOLIC PANEL
BUN: 22 mg/dL (ref 6–23)
BUN: 23 mg/dL (ref 6–23)
CO2: 31 mEq/L (ref 19–32)
Calcium: 9.1 mg/dL (ref 8.4–10.5)
Chloride: 104 mEq/L (ref 96–112)
Creatinine, Ser: 0.62 mg/dL (ref 0.50–1.10)
Creatinine, Ser: 0.65 mg/dL (ref 0.50–1.10)
GFR calc Af Amer: >90 mL/min (ref >90)
GFR calc non Af Amer: >90 mL/min (ref >90)

## 2011-06-01 LAB — POCT I-STAT 3, ART BLOOD GAS (G3+)
Acid-Base Excess: 8 mmol/L — ABNORMAL HIGH (ref 0.0–2.0)
O2 Saturation: 93 %
Patient temperature: 100.8
pO2, Arterial: 65 mmHg — ABNORMAL LOW (ref 80.0–100.0)

## 2011-06-01 LAB — URINE CULTURE
Culture  Setup Time: 201301291422
Culture: NO GROWTH

## 2011-06-01 LAB — CLOSTRIDIUM DIFFICILE BY PCR: Toxigenic C. Difficile by PCR: NEGATIVE

## 2011-06-01 LAB — VANCOMYCIN, TROUGH: Vancomycin Tr: 19 ug/mL (ref 10.0–20.0)

## 2011-06-01 MED ORDER — POTASSIUM CHLORIDE 20 MEQ/15ML (10%) PO LIQD
ORAL | Status: AC
  Filled 2011-06-01: qty 15

## 2011-06-01 MED ORDER — LORAZEPAM 2 MG/ML IJ SOLN
2.0000 mg | INTRAMUSCULAR | Status: DC | PRN
  Administered 2011-06-01 (×2): 2 mg via INTRAVENOUS
  Filled 2011-06-01 (×3): qty 1

## 2011-06-01 MED ORDER — SODIUM CHLORIDE 0.9 % IJ SOLN
INTRAMUSCULAR | Status: AC
  Administered 2011-06-01: 13:00:00
  Filled 2011-06-01: qty 10

## 2011-06-01 MED ORDER — LORAZEPAM 2 MG/ML IJ SOLN
INTRAMUSCULAR | Status: AC
  Administered 2011-06-01: 1 mg
  Filled 2011-06-01: qty 1

## 2011-06-01 MED ORDER — FUROSEMIDE 10 MG/ML IJ SOLN
INTRAMUSCULAR | Status: AC
  Administered 2011-06-01: 40 mg
  Filled 2011-06-01: qty 4

## 2011-06-01 MED ORDER — SODIUM CHLORIDE 0.9 % IV SOLN
0.4000 ug/kg/h | INTRAVENOUS | Status: DC
  Administered 2011-06-01 – 2011-06-02 (×4): 1.2 ug/kg/h via INTRAVENOUS
  Filled 2011-06-01 (×4): qty 4

## 2011-06-01 MED ORDER — FUROSEMIDE 10 MG/ML IJ SOLN
40.0000 mg | Freq: Four times a day (QID) | INTRAMUSCULAR | Status: AC
Start: 2011-06-01 — End: 2011-06-01
  Filled 2011-06-01: qty 4

## 2011-06-01 MED ORDER — FUROSEMIDE 10 MG/ML IJ SOLN: 40.0000 mg | Freq: Once | INTRAMUSCULAR | Status: AC

## 2011-06-01 MED ORDER — PRO-STAT SUGAR FREE PO LIQD
30.0000 mL | Freq: Four times a day (QID) | ORAL | Status: DC
  Administered 2011-06-01 (×3): 30 mL
  Filled 2011-06-01 (×7): qty 30

## 2011-06-01 MED ORDER — OXEPA PO LIQD
30.0000 mL/h | ORAL | Status: DC
  Administered 2011-06-01: 30 mL/h
  Filled 2011-06-01 (×5): qty 1000

## 2011-06-01 MED ORDER — SODIUM CHLORIDE 0.9 % IV SOLN
0.4000 ug/kg/h | INTRAVENOUS | Status: DC
  Administered 2011-06-01 (×2): 1 ug/kg/h via INTRAVENOUS
  Administered 2011-06-01: 1.2 ug/kg/h via INTRAVENOUS
  Administered 2011-06-01: 0.4 ug/kg/h via INTRAVENOUS
  Filled 2011-06-01 (×5): qty 2

## 2011-06-01 MED ORDER — POTASSIUM CHLORIDE 20 MEQ/15ML (10%) PO LIQD
40.0000 meq | Freq: Every day | ORAL | Status: DC
  Administered 2011-06-01 – 2011-06-03 (×3): 40 meq via ORAL
  Filled 2011-06-01 (×3): qty 30

## 2011-06-01 MED ORDER — HALOPERIDOL LACTATE 5 MG/ML IJ SOLN
2.0000 mg | Freq: Once | INTRAMUSCULAR | Status: AC
  Administered 2011-06-01: 2 mg via INTRAVENOUS
  Filled 2011-06-01: qty 1

## 2011-06-01 MED ORDER — LORAZEPAM 2 MG/ML IJ SOLN
1.0000 mg | INTRAMUSCULAR | Status: DC | PRN
  Administered 2011-06-01: 1 mg via INTRAVENOUS
  Filled 2011-06-01: qty 1

## 2011-06-01 MED ORDER — WHITE PETROLATUM GEL
Status: AC
  Administered 2011-06-01: 09:00:00
  Filled 2011-06-01: qty 5

## 2011-06-01 MED ORDER — FENTANYL CITRATE 0.05 MG/ML IJ SOLN
50.0000 ug | INTRAMUSCULAR | Status: DC | PRN
  Administered 2011-06-01 – 2011-06-02 (×6): 100 ug via INTRAVENOUS
  Filled 2011-06-01 (×7): qty 2

## 2011-06-01 NOTE — Progress Notes (Signed)
ANTIBIOTIC CONSULT NOTE - FOLLOW UP  Pharmacy Consult for Vancomycin Indication: possible aspiration PNA   Allergies  Allergen Reactions  . Codeine     Upset stomach    Patient Measurements: Height: 5\' 3"  (160 cm) Weight: 217 lb 6 oz (98.6 kg) IBW/kg (Calculated) : 52.4   Vital Signs: Temp: 100.7 F (38.2 C) (01/30 1951) BP: 128/60 mmHg (01/30 1951) Pulse Rate: 77  (01/30 1951) Intake/Output from previous day: 01/29 0701 - 01/30 0700 In: 3632.3 [I.V.:1449.8; NG/GT:1145; IV Piggyback:1037.5] Out: 3310 [Urine:3310] Intake/Output from this shift:    Labs:  Basename 06/01/11 1152 06/01/11 0730 05/31/11 1600 05/31/11 0509 05/30/11 0500  WBC -- 10.9* -- 12.2* 11.6*  HGB -- 9.3* -- 9.1* 8.9*  PLT -- 272 -- 192 186  LABCREA -- -- -- -- --  CREATININE 0.65 0.62 0.71 -- --   Estimated Creatinine Clearance: 91 ml/min (by C-G formula based on Cr of 0.65).  Basename 06/01/11 1930  VANCOTROUGH 19.0  VANCOPEAK --  Drue Dun --  GENTTROUGH --  GENTPEAK --  GENTRANDOM --  TOBRATROUGH --  TOBRAPEAK --  TOBRARND --  AMIKACINPEAK --  AMIKACINTROU --  AMIKACIN --     Microbiology: Recent Results (from the past 720 hour(s))  URINE CULTURE     Status: Normal   Collection Time   05/27/11  6:06 AM      Component Value Range Status Comment   Specimen Description URINE, CATHETERIZED   Final    Special Requests NONE   Final    Culture  Setup Time [id number]   Final    Colony Count NO GROWTH   Final    Culture NO GROWTH   Final    Report Status 05/28/2011 FINAL   Final   CULTURE, BLOOD (ROUTINE X 2)     Status: Normal (Preliminary result)   Collection Time   05/27/11  6:10 AM      Component Value Range Status Comment   Specimen Description BLOOD RIGHT ARM   Final    Special Requests BOTTLES DRAWN AEROBIC ONLY [facility]   Final    Culture  Setup Time [id number]   Final    Culture     Final    Value:        BLOOD CULTURE RECEIVED NO GROWTH TO DATE CULTURE WILL BE HELD FOR 5  DAYS BEFORE ISSUING A FINAL NEGATIVE REPORT   Report Status PENDING   Incomplete   CULTURE, BLOOD (ROUTINE X 2)     Status: Normal (Preliminary result)   Collection Time   05/27/11  6:30 AM      Component Value Range Status Comment   Specimen Description BLOOD LEFT ARM   Final    Special Requests BOTTLES DRAWN AEROBIC AND ANAEROBIC 5CC   Final    Culture  Setup Time [id number]   Final    Culture     Final    Value:        BLOOD CULTURE RECEIVED NO GROWTH TO DATE CULTURE WILL BE HELD FOR 5 DAYS BEFORE ISSUING A FINAL NEGATIVE REPORT   Report Status PENDING   Incomplete   URINE CULTURE     Status: Normal   Collection Time   05/27/11  9:14 AM      Component Value Range Status Comment   Specimen Description URINE, CATHETERIZED   Final    Special Requests NONE   Final    Culture  Setup Time [id number]   Final    Colony Count  NO GROWTH   Final    Culture NO GROWTH   Final    Report Status 05/28/2011 FINAL   Final   MRSA PCR SCREENING     Status: Normal   Collection Time   05/27/11  9:14 AM      Component Value Range Status Comment   MRSA by PCR NEGATIVE  NEGATIVE  Final   CULTURE, RESPIRATORY     Status: Normal   Collection Time   05/27/11  9:52 AM      Component Value Range Status Comment   Specimen Description ENDOTRACHEAL   Final    Special Requests Immunocompromised   Final    Gram Stain     Final    Value: MODERATE WBC PRESENT, PREDOMINANTLY PMN     NO SQUAMOUS EPITHELIAL CELLS SEEN     RARE GRAM POSITIVE COCCI     IN PAIRS   Culture Non-Pathogenic Oropharyngeal-type Flora Isolated.   Final    Report Status 05/29/2011 FINAL   Final   URINE CULTURE     Status: Normal   Collection Time   05/31/11 12:35 PM      Component Value Range Status Comment   Specimen Description URINE, CATHETERIZED   Final    Special Requests PT ON ZOSYN   Final    Culture  Setup Time [id number]   Final    Colony Count NO GROWTH   Final    Culture NO GROWTH   Final    Report Status 06/01/2011  FINAL   Final   CULTURE, BLOOD (ROUTINE X 2)     Status: Normal (Preliminary result)   Collection Time   05/31/11  1:18 PM      Component Value Range Status Comment   Specimen Description BLOOD LEFT ARM   Final    Special Requests BOTTLES DRAWN AEROBIC AND ANAEROBIC 10CC   Final    Culture  Setup Time [id number]   Final    Culture     Final    Value:        BLOOD CULTURE RECEIVED NO GROWTH TO DATE CULTURE WILL BE HELD FOR 5 DAYS BEFORE ISSUING A FINAL NEGATIVE REPORT   Report Status PENDING   Incomplete   CULTURE, BLOOD (ROUTINE X 2)     Status: Normal (Preliminary result)   Collection Time   05/31/11  1:19 PM      Component Value Range Status Comment   Specimen Description BLOOD RIGHT ARM   Final    Special Requests BOTTLES DRAWN AEROBIC AND ANAEROBIC 10CC   Final    Culture  Setup Time [id number]   Final    Culture     Final    Value:        BLOOD CULTURE RECEIVED NO GROWTH TO DATE CULTURE WILL BE HELD FOR 5 DAYS BEFORE ISSUING A FINAL NEGATIVE REPORT   Report Status PENDING   Incomplete   CULTURE, RESPIRATORY     Status: Normal (Preliminary result)   Collection Time   05/31/11  2:58 PM      Component Value Range Status Comment   Specimen Description ENDOTRACHEAL   Final    Special Requests NONE   Final    Gram Stain     Final    Value: MODERATE WBC PRESENT,BOTH PMN AND MONONUCLEAR     NO SQUAMOUS EPITHELIAL CELLS SEEN     NO ORGANISMS SEEN   Culture NO GROWTH   Final    Report Status PENDING  Incomplete   CLOSTRIDIUM DIFFICILE BY PCR     Status: Normal   Collection Time   06/01/11 11:45 AM      Component Value Range Status Comment   C difficile by pcr NEGATIVE  NEGATIVE  Final     Anti-infectives     Start     Dose/Rate Route Frequency Ordered Stop   05/31/11 1200   ciprofloxacin (CIPRO) IVPB 400 mg        400 mg 200 mL/hr over 60 Minutes Intravenous Every 12 hours 05/31/11 1046     05/31/11 1200   vancomycin (VANCOCIN) IVPB 1000 mg/200 mL premix        1,000  mg 200 mL/hr over 60 Minutes Intravenous Every 8 hours 05/31/11 1047     05/28/11 2200   vancomycin (VANCOCIN) IVPB 1000 mg/200 mL premix  Status:  Discontinued        1,000 mg 200 mL/hr over 60 Minutes Intravenous Every 12 hours 05/28/11 1101 05/29/11 0900   05/27/11 1500   piperacillin-tazobactam (ZOSYN) IVPB 3.375 g  Status:  Discontinued        3.375 g 12.5 mL/hr over 240 Minutes Intravenous Every 8 hours 05/27/11 0904 05/31/11 1046   05/27/11 1000   vancomycin (VANCOCIN) IVPB 1000 mg/200 mL premix  Status:  Discontinued        1,000 mg 200 mL/hr over 60 Minutes Intravenous Every 24 hours 05/27/11 0904 05/28/11 1101   05/27/11 0900   piperacillin-tazobactam (ZOSYN) IVPB 3.375 g  Status:  Discontinued        3.375 g 100 mL/hr over 30 Minutes Intravenous  Once 05/27/11 0848 05/27/11 0858   05/27/11 0645   piperacillin-tazobactam (ZOSYN) IVPB 4.5 g  Status:  Discontinued        4.5 g 134 mL/hr over 30 Minutes Intravenous  Once 05/27/11 0636 05/27/11 0637   05/27/11 0645  piperacillin-tazobactam (ZOSYN) IVPB 3.375 g       3.375 g 12.5 mL/hr over 240 Minutes Intravenous  Once 05/27/11 0642 05/27/11 1045   05/27/11 0615   piperacillin-tazobactam (ZOSYN) IVPB 4.5 g  Status:  Discontinued        4.5 g 200 mL/hr over 30 Minutes Intravenous  Once 05/27/11 0603 05/27/11 0722   05/27/11 0615   vancomycin (VANCOCIN) 1,000 mg in sodium chloride 0.9 % 250 mL IVPB  Status:  Discontinued        1,000 mg 250 mL/hr over 60 Minutes Intravenous  Once 05/27/11 0603 05/27/11 0609   05/27/11 0615   vancomycin (VANCOCIN) IVPB 1000 mg/200 mL premix  Status:  Discontinued        1,000 mg 200 mL/hr over 60 Minutes Intravenous  Once 05/27/11 0609 05/27/11 0644          Assessment: 55 YOF on Vanc/cipro D#2 for possible aspiration PNA/sepsis, UCx negative, BCx and resp cx NGTD,  - Tmax 101.4, WBC 10.9, renal fxn WNL. Patient received 4 doses of vancomycin,  trough level therapeutic = 19  mcg/mL  Goal of Therapy:  Vancomycin trough level 15-20 mcg/ml  Plan:  Continue vancomycin 1g IV Q8hrs Continue monitor renal function and cultures.  Riki Rusk 06/01/2011,9:16 PM

## 2011-06-01 NOTE — Significant Event (Signed)
Pt noted to remain agitated in spite of precedex infusion and fentanyl boluses.  Has also received ativan bolus with marginal improvement.  Will increase dose and frequency of ativan boluses and monitor.  Coralyn Helling, MD 06/01/2011, 8:40 PM [phone]

## 2011-06-01 NOTE — Progress Notes (Signed)
Name: Valerie Perry MRN: [id number] DOB: 06-14-1955    LOS: 5  PCCM Progress NOTE   Subjective: 56 y/o female with past medical history of recent bunion surgery, nausea, vomiting admitted 1/25 for aspiration pneumonia with respiratory distress and severe sepsis with shock.  Continues to be Febrile, Tmx 101.7 overnight, continued thick secretions on suction and much agitation on decreased sedation, maintained on Propofol 30 mcg/kg/min overnight and Fentanyl 100 mcg/h, no desats on turning and bathing overnight as reported per nursing yesterday, no acute events on telemetry  Lines / Drains: 1/25 ETT >>  1/25 L subclavian CVL >>  Cultures: 1/25 blood >> ng 1/25 sputum >> pending 1/25 resp gm stain>> rare gm + cocci in prs 1/25 urine >> ng 1/25 procalc  5 -->> 3.9--> 2.28 (1/29) 1/25 lactic acid 5.4--> 2.8 --> 1.3 (1/29) 1/25 flu >> neg 1/29 blood >> pending 1/29 Urine >> pending 1/29 Resp >> no organisms  Antibiotics: Zosyn 1/25 >> 1/29 Vanc 1/25 >>1/27, 1/29 >> Cipro 1/29 >>   Tests / Events: 1/25 head CT neg 1/25 Abd U/S  neg 1/25 CXR consolidation left lung apex with pulm vascular congestion 1/26 CXR slight improvement in bibasilar opacities c/w improving pneumonia 1/27 CXR no improvement in bibasilar opacities 1/28 CXR Persistent multilobar interstitial & airspace opacities, c/w multilobar pneumonia 1/29 CXR Significantly worsening bilateral diffuse lung opacities  1/29 XR left foot >>> No definite bony destruction is identified. There is no soft tissue gas. 1/30 CXR >>> improving patchy bilateral airspace disease  Vital Signs: Temp:  [96.4 F (35.8 C)-101.7 F (38.7 C)] 101.4 F (38.6 C) (01/30 1131) Pulse Rate:  [65-98] 96  (01/30 1131) Resp:  [18-38] 38  (01/30 1131) BP: (106-150)/(56-125) 135/78 mmHg (01/30 1131) SpO2:  [92 %-100 %] 97 % (01/30 1131) FiO2 (%):  [39.6 %-40.6 %] 40 % (01/30 1131) Weight:  [217 lb 6 oz (98.6 kg)] 217 lb 6 oz (98.6 kg) (01/30  0442) I/O last 3 completed shifts: In: 4945.5 [I.V.:2148; NG/GT:1685; IV Piggyback:1112.5] Out: 3800 [Urine:3800]  Physical Examination: Gen: NAD, Sedated, intubated HEENT: NCAT  Neck: supple without masses  PULM: good movement of air bilaterally CV: RRR, no m/r/g appreciated AB: BS+, soft, nontender, no hsm  Ext: warm, 1-2 edema bilateral LE and dorsum of hands, no clubbing, no cyanosis, s/p surgery L foot with bandages c/d/i Neuro: RASS -1. MAEs. Intermittently agitated  Ventilator settings: Vent Mode:  [-] PCV FiO2 (%):  [39.6 %-40.6 %] 40 % Set Rate:  [12 bmp-15 bmp] 15 bmp PEEP:  [5 cmH20] 5 cmH20 Plateau Pressure:  [18 cmH20-28 cmH20] 18 cmH20  Labs and Imaging:   Basic Metabolic Panel:  Basename 06/01/11 0730 05/31/11 1600  NA 144 147*  K 3.6 3.8  CL 104 105  CO2 31 33*  GLUCOSE 112* 109*  BUN 22 24*  CREATININE 0.62 0.71  CALCIUM 8.7 8.6  MG -- --  PHOS -- --   Liver Function Tests:  Basename 05/31/11 0509 05/30/11 0500  AST 46* 84*  ALT 254* 359*  ALKPHOS 83 61  BILITOT 0.5 0.5  PROT 5.6* 5.2*  ALBUMIN 1.9* 1.8*   CBC:  Basename 06/01/11 0730 05/31/11 0509  WBC 10.9* 12.2*  NEUTROABS -- --  HGB 9.3* 9.1*  HCT 28.7* 28.2*  MCV 93.2 93.7  PLT 272 192    CBG:  Basename 05/31/11 0435 05/31/11 0016 05/30/11 1947 05/30/11 1151 05/30/11 0816 05/30/11 0342  GLUCAP 120* 110* 113* 132* 135* 113*  Assessment and Plan: HD# 6 for 56 y/o female with recent bunion surgery, nausea, vomiting admitted for aspiration pneumonia with respiratory distress and severe sepsis with shock.  Respiratory 1. Aspiration Pneumonia:  febrile with ~Tmax 101.4, on Day #2 of Vancomycin (previous 2 day course) , CXR with improving edema, likely need for continued diuresis to extent bp and renal function can tolerate with target neg 1L and will supplement with K 40 mEq Plan- -Lasix for 40 mg x2 -cont Vancomycin Day #2 -continue to monitor CXR and respiratory  status -continue Tylenol prn  2. Respiratory failure, hypoxic: improved and stable, likely secondary to aspiration pneumonia associated with vomiting and delirium.  Pt awake and responding to commands with decrease of sedation, hemodynamically stable on Pressure Control on 40%, RR 12, Peep 5, hopeful for extubation in next day or so Plan -continue mechanical ventilation - -Pressure Control of 20, RR 15, shorten inspiratory time to 0.8 -cont pulm toilette  -change Fentanyl to prn -start low dose Precedex gtt -d/c Propofol  -cont sbt   Lab 05/30/11 0955 05/29/11 1124 05/28/11 0423 05/28/11 0419 05/28/11 0052 05/27/11 1049  PHART 7.409* 7.423* -- 7.407* -- 7.316*  PCO2ART 44.5 40.0 -- 41.2 -- 43.2  PO2ART 66.0* 79.0* -- 73.1* -- 80.0  HCO3 27.7* 26.0* -- 25.4* -- 22.1  TCO2 29 27 -- 26.7 -- 23  O2SAT 91.0 95.0 73.5 95.6 75.1 --    Infectious Disease 3. SIRS/sepsis, severe: maintaining bp off pressors, lactate trending towards normal, BCX without growth to date, increasing leukocytosis, Pro-calcitonin still elevated at 2.28, lactic acid 1.3, KUB without obstruction or ileus, foot XR w/o signs of osteomyelitis, Urine negative, on Hep for DVT ppx PLAN- -cont Vancomycin -cont Ciprofloxacin 400 mg IV q12h -BCx pending -UCX pending -Resp Cx ngtd -consider trial of Solumedrol 40 mg q12h if CX return negative -check PCT tomorrow for trend  Lab 06/01/11 0730 05/31/11 0509 05/30/11 0500 05/29/11 0430 05/28/11 0416  WBC 10.9* 12.2* 11.6* 8.2 9.6    4. S/p left bunion surgery: pt with implanted wired in left foot left foot incision site c/d/i w/o signs of infection. XR left foot w/o definite bony changes or soft tissue air PLAN- -cont to monitor  Renal 5. Acute Kidney Injury:  Likely due to severe sepsis vs pre-renal from nausea and vomiting. Adequate response to Lasix, still with moderate volume overload, creatinine normal. UOP improved Plan -cont KVO -continue free water bolus -Lasix  40 mg IV x2 -follow uop -continue daily BMET   Lab 06/01/11 0730 05/31/11 1600 05/31/11 0509 05/30/11 0500 05/29/11 0430  CREATININE 0.62 0.71 0.72 0.72 0.73    Intake/Output Summary (Last 24 hours) at 06/01/11 1153 Last data filed at 06/01/11 1000  Gross per 24 hour  Intake 3440.71 ml  Output   4460 ml  Net -1019.29 ml    Endocrine 6. Hypernatremia: likely secondary to tube feeds and diuretic -cont free water bolus 200cc q8h -cont monitor BMET  Neurology 7. Delirium: unclear etiology, possibly narcortic or hypoxemia, pt without response to Narcan in ED, husband reports pt does not like to take pain medications, Acetaminophen level low, CT of head without acute findings Plan -Continue WUA -haldol prn rather than versed   Cardiovascular 8. Elevated Cardiac Enzymes: EKG with 1mm ST depression in lateral leads, likely secondary to demand ischemia, ECHO without acute findings, Cardiology consulted for further recommendations Plan -appreciate Cardiology recommendations, no further wu planned    Lab 05/28/11 1400 05/27/11 1100 05/27/11 0600  TROPONINI  1.03* 1.94* 0.53*    Gastroenterology 9. Nausea and Vomiting/ transaminitis: KUB w/o acute findings, on Tube Feeds, LFTs trending towards normal, ischemic hepatitis possible -continue to follow Hepatic function -continue Tylenol from q8h prn fever  Lab 05/31/11 0509 05/30/11 0500 05/29/11 0430 05/28/11 1400 05/27/11 0600  AST 46* 84* 187* 201* 140*  ALT 254* 359* 494* 401* 191*  ALKPHOS 83 61 48 45 79  BILITOT 0.5 0.5 0.6 0.4 0.4  PROT 5.6* 5.2* 5.1* 5.1* 7.1  ALBUMIN 1.9* 1.8* 2.0* 2.2* 3.7  INR -- -- -- -- 1.21    10. Nutrition: currently on tube feeds Pro-stat and Oxepa, target caloric needs 1200 -cont Oxepa 30 cc/h -continue Pro-stat -appreciate Dietician recs  11.  Disposition: Full code, husband and family at bedside, informed of pt status and plans of care      SCHOOLER, KAREN 06/01/2011, 11:53 AM  Pt  seen and examined and database reviewed. I agree with above findings, assessment and plan. 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  Will cont current abx and recheck PCT in morning. If cultures remain negative and PCT is normalizing, will consider trial of steroids for "late phase ARDS". Family updated in detail @ bedside. 35 mins CCM time  Billy Fischer, MD;  PCCM service; [place] [phone]

## 2011-06-01 NOTE — Progress Notes (Signed)
Chaplain Note:  Upon referral from asst. unit director, chaplain visited with pt and pt's husband.  Chaplain introduced Valerie Perry to pt and pt's spouse and was invited into the room.  Pt was in bed, intubated, awake, alert, and very anxious.  Chaplin offered spiritual comfort and support for pt and pt's spouse.  Chaplain sensed that his presence in the room was adding to the pt's anxiety.  Chaplain consulted with pt nurse about this feeling.  Chaplain informed pt and pt's husband that chaplain services were available round the clock.  Pt spouse stated that he and pt were receiving spiritual support from their pastor.  Chaplain will follow up as requested.  Verdie Shire, chaplain resident [phone]

## 2011-06-01 NOTE — Progress Notes (Signed)
Nutrition Follow-up  Diet Order:  Oxepa at 30 ml/h with Prostat 30 ml BID providing 1224 kcals, 75 grams protein, 565 ml free water daily.  Free water:  200 ml every 8 hours.  Meds: Scheduled Meds:   . albuterol-ipratropium  4 puff Inhalation Q6H  . antiseptic oral rinse  15 mL Mouth Rinse QID  . chlorhexidine  15 mL Mouth/Throat BID  . ciprofloxacin  400 mg Intravenous Q12H  . feeding supplement  30 mL Per Tube BID  . free water  200 mL Per Tube Q8H  . furosemide      . furosemide      . furosemide  40 mg Intravenous Once  . furosemide  40 mg Intravenous Once  . heparin  5,000 Units Subcutaneous Q8H  . pantoprazole sodium  40 mg Per Tube Q1200  . potassium chloride  40 mEq Per Tube Daily  . vancomycin  1,000 mg Intravenous Q8H  . white petrolatum       Continuous Infusions:   . sodium chloride Stopped (05/31/11 1200)  . feeding supplement (OXEPA)    . fentaNYL infusion INTRAVENOUS Stopped (06/01/11 1000)  . midazolam (VERSED) infusion Stopped (05/30/11 1000)  . propofol Stopped (06/01/11 1000)  . DISCONTD: feeding supplement (PROMOTE) 45 mL/hr (05/30/11 1842)   PRN Meds:.sodium chloride, acetaminophen (TYLENOL) oral liquid 160 mg/5 mL, acetaminophen, fentaNYL, midazolam  Labs:  CMP     Component Value Date/Time   NA 144 06/01/2011 0730   K 3.6 06/01/2011 0730   CL 104 06/01/2011 0730   CO2 31 06/01/2011 0730   GLUCOSE 112* 06/01/2011 0730   BUN 22 06/01/2011 0730   CREATININE 0.62 06/01/2011 0730   CALCIUM 8.7 06/01/2011 0730   PROT 5.6* 05/31/2011 0509   ALBUMIN 1.9* 05/31/2011 0509   AST 46* 05/31/2011 0509   ALT 254* 05/31/2011 0509   ALKPHOS 83 05/31/2011 0509   BILITOT 0.5 05/31/2011 0509   GFRNONAA >90 06/01/2011 0730   GFRAA >90 06/01/2011 0730   CBG (last 3)   Basename 05/31/11 0435 05/31/11 0016 05/30/11 1947  GLUCAP 120* 110* 113*     Intake/Output Summary (Last 24 hours) at 06/01/11 1058 Last data filed at 06/01/11 1000  Gross per 24 hour  Intake 3550.51  ml  Output   4560 ml  Net -1009.49 ml    Weight Status:  98.6 kg (up from 89.3 kg on 1/26)  Estimated Nutrition Needs:  2000 kcals, 100-110 grams protein, 2-2.4 liters fluid daily  Re-estimated nutrition goals (based on ASPEN guidelines for critically ill obese patients):  1200-1400 kcals, 100-110 grams protein daily  Nutrition Dx:  Inadequate oral intake, ongoing.  Goal:  Enteral nutrition to provide 60-70% of estimated calorie needs (22-25 kcals/kg ideal body weight) and 100% of estimated protein needs, based on ASPEN guidelines for permissive underfeeding in critically ill obese individuals, unmet at this time--TF is only providing 75% of minimum estimated protein needs.  Intervention:  Continue Oxepa at 30 ml/h, increase Prostat to 30 ml QID to increase intake to 1368 kcals, 105 grams protein, 565 ml free water daily.  Monitor:  TF tolerance/adequacy, weight trend, labs.   Hettie Holstein Pager #:  [phone]

## 2011-06-02 ENCOUNTER — Inpatient Hospital Stay (HOSPITAL_COMMUNITY): Payer: BC Managed Care – PPO

## 2011-06-02 DIAGNOSIS — Z9911 Dependence on respirator [ventilator] status: Secondary | ICD-10-CM

## 2011-06-02 DIAGNOSIS — J9600 Acute respiratory failure, unspecified whether with hypoxia or hypercapnia: Secondary | ICD-10-CM

## 2011-06-02 DIAGNOSIS — J690 Pneumonitis due to inhalation of food and vomit: Secondary | ICD-10-CM

## 2011-06-02 DIAGNOSIS — J984 Other disorders of lung: Secondary | ICD-10-CM

## 2011-06-02 LAB — BASIC METABOLIC PANEL
CO2: 29 mEq/L (ref 19–32)
Calcium: 8.8 mg/dL (ref 8.4–10.5)
Creatinine, Ser: 0.57 mg/dL (ref 0.50–1.10)
GFR calc non Af Amer: >90 mL/min (ref >90)

## 2011-06-02 LAB — CULTURE, BLOOD (ROUTINE X 2)
Culture  Setup Time: 201301251359
Culture  Setup Time: 201301251358
Culture: NO GROWTH

## 2011-06-02 LAB — HEPATIC FUNCTION PANEL
ALT: 130 U/L — ABNORMAL HIGH (ref 0–35)
Alkaline Phosphatase: 106 U/L (ref 39–117)
Indirect Bilirubin: 0.4 mg/dL (ref 0.3–0.9)
Total Bilirubin: 0.5 mg/dL (ref 0.3–1.2)
Total Protein: 5.9 g/dL — ABNORMAL LOW (ref 6.0–8.3)

## 2011-06-02 MED ORDER — FENTANYL CITRATE 0.05 MG/ML IJ SOLN: 12.5000 ug | INTRAMUSCULAR | Status: DC | PRN

## 2011-06-02 MED ORDER — LIDOCAINE HCL (PF) 1 % IJ SOLN
INTRAMUSCULAR | Status: AC
  Filled 2011-06-02: qty 5

## 2011-06-02 MED ORDER — METHYLPREDNISOLONE SODIUM SUCC 125 MG IJ SOLR
80.0000 mg | Freq: Once | INTRAMUSCULAR | Status: AC
  Administered 2011-06-02: 80 mg via INTRAVENOUS
  Filled 2011-06-02: qty 2

## 2011-06-02 MED ORDER — ALBUTEROL SULFATE (5 MG/ML) 0.5% IN NEBU
2.5000 mg | INHALATION_SOLUTION | RESPIRATORY_TRACT | Status: DC
  Administered 2011-06-02: 2.5 mg via RESPIRATORY_TRACT

## 2011-06-02 MED ORDER — ALBUTEROL SULFATE (5 MG/ML) 0.5% IN NEBU
2.5000 mg | INHALATION_SOLUTION | Freq: Four times a day (QID) | RESPIRATORY_TRACT | Status: DC
  Administered 2011-06-02 – 2011-06-06 (×13): 2.5 mg via RESPIRATORY_TRACT
  Filled 2011-06-02 (×13): qty 0.5

## 2011-06-02 MED ORDER — ALBUMIN HUMAN 5 % IV SOLN
INTRAVENOUS | Status: AC
  Filled 2011-06-02: qty 250

## 2011-06-02 MED ORDER — ALBUTEROL SULFATE (5 MG/ML) 0.5% IN NEBU: 2.5000 mg | INHALATION_SOLUTION | Freq: Four times a day (QID) | RESPIRATORY_TRACT | Status: DC

## 2011-06-02 NOTE — Progress Notes (Signed)
Name: Valerie Perry MRN: [id number] DOB: 09-17-1955    LOS: 6  PCCM Progress NOTE   Subjective: 56 y/o female with past medical history of recent bunion surgery, nausea, vomiting admitted 1/25 for aspiration pneumonia with respiratory distress and severe sepsis with shock.  Continues to be mildly febrile at Tc 99.6, continued thick secretions on suction and much agitation throughout the night on max Precedex drip, Ativan q2h and Fentynl prn, no acute events on telemetry  Lines / Drains: 1/25 ETT >> 1/31 1/25 L subclavian CVL >>  Cultures: 1/25 blood >> ng 1/25 sputum >> pending 1/25 resp gm stain>> rare gm + cocci in prs 1/25 urine >> ng 1/25 procalc  5 -->> 3.9--> 2.28 (1/29)--> pending (1/31) 1/25 lactic acid 5.4--> 2.8 --> 1.3 (1/29) 1/25 flu >> neg 1/29 blood >> ngtd 1/29 Urine >> no growth 1/29 Resp Cx>> no organisms  Antibiotics: Zosyn 1/25 >> 1/29 Vanc 1/25 >>1/27, 1/29 >> Cipro 1/29 >>   Tests / Events: 1/25 head CT neg 1/25 Abd U/S  neg 1/25 CXR consolidation left lung apex with pulm vascular congestion 1/26 CXR slight improvement in bibasilar opacities c/w improving pneumonia 1/27 CXR no improvement in bibasilar opacities 1/28 CXR Persistent multilobar interstitial & airspace opacities, c/w multilobar pneumonia 1/29 CXR Significantly worsening bilateral diffuse lung opacities  1/29 XR left foot >>> No definite bony destruction is identified. There is no soft tissue gas. 1/30 CXR >>> improving patchy bilateral airspace disease  Vital Signs: Temp:  [99.4 F (37.4 C)-101.4 F (38.6 C)] 99.6 F (37.6 C) (01/31 0600) Pulse Rate:  [46-98] 68  (01/31 0600) Resp:  [18-42] 28  (01/31 0600) BP: (105-147)/(58-103) 105/59 mmHg (01/31 0600) SpO2:  [95 %-100 %] 96 % (01/31 0600) FiO2 (%):  [39.6 %-40.5 %] 39.9 % (01/31 0600) Weight:  [203 lb 14.8 oz (92.5 kg)] 203 lb 14.8 oz (92.5 kg) (01/31 0500) I/O last 3 completed shifts: In: 5241.4 [I.V.:2148.9; NG/GT:1655; IV  Piggyback:1437.5] Out: 8740 [Urine:8740]  Physical Examination: Gen: Intermittently agitated HEENT: NCAT  Neck: supple without masses  PULM: Slightly coarse bilaterally CV: RRR, no m/r/g appreciated AB: BS+, soft, nontender, no hsm  Ext: warm, 1-2 edema bilateral LE and dorsum of hands, no clubbing, no cyanosis, s/p surgery L foot with bandages c/d/i Neuro: RASS 0. MAEs. Intermittently agitated  Ventilator settings: Vent Mode:  [-] PCV FiO2 (%):  [39.6 %-40.5 %] 39.9 % Set Rate:  [12 bmp-15 bmp] 15 bmp PEEP:  [5 cmH20] 5 cmH20 Plateau Pressure:  [18 cmH20-28 cmH20] 19 cmH20  Labs and Imaging:   Basic Metabolic Panel:  Basename 06/01/11 1152 06/01/11 0730  NA 144 144  K 3.5 3.6  CL 101 104  CO2 33* 31  GLUCOSE 125* 112*  BUN 23 22  CREATININE 0.65 0.62  CALCIUM 9.1 8.7  MG -- --  PHOS -- --   CBC:  Basename 06/01/11 0730 05/31/11 0509  WBC 10.9* 12.2*  NEUTROABS -- --  HGB 9.3* 9.1*  HCT 28.7* 28.2*  MCV 93.2 93.7  PLT 272 192    CBG:  Basename 06/01/11 1953 05/31/11 0435 05/31/11 0016 05/30/11 1947 05/30/11 1151 05/30/11 0816  GLUCAP 158* 120* 110* 113* 132* 135*    Assessment and Plan: HD# 7 for 56 y/o female with recent bunion surgery, nausea, vomiting admitted for aspiration pneumonia with respiratory distress and severe sepsis with shock.  Respiratory 1. Aspiration Pneumonia:  Mildly febrile, on Day #3 of Vancomycin (previous 2 day course) and Ciprofloxacin ,  most recent CXR with improving edema not changed much from yesterday Plan- -cont Vancomycin Day #3 -continue to monitor CXR and respiratory status -continue Tylenol prn  2. Respiratory failure, hypoxic: improved and stable, with non-resolving ARDS likely secondary to aspiration pneumonia associated with vomiting and delirium.  Pt awake and responding to commands with decrease of sedation, hemodynamically stable on Pressure Control on 40%, RR 12, Peep 5 Plan -tolerating pressure support during  exam ---> extubated, some expiratory stridor noted -will give Solumedrol 80 mg IV x1 -start scheduled albuterol nebs -d/c combivent inhaler -Fentanyl 12.5-25 q1h prn   Lab 06/01/11 0853 05/30/11 0955 05/29/11 1124 05/28/11 0423 05/28/11 0419 05/27/11 1049  PHART 7.481* 7.409* 7.423* -- 7.407* 7.316*  PCO2ART 42.4 44.5 40.0 -- 41.2 43.2  PO2ART 65.0* 66.0* 79.0* -- 73.1* 80.0  HCO3 31.4* 27.7* 26.0* -- 25.4* 22.1  TCO2 33 29 27 -- 26.7 23  O2SAT 93.0 91.0 95.0 73.5 95.6 --    Infectious Disease 3. SIRS/sepsis, severe: currently afebrile, maintaining bp off pressors, lactate trending towards normal, BCX without growth to date, decreasing leukocytosis, Pro-calcitonin 0.66 today, last lactic acid 1.3, no source of infection from GI, surgical foot site, urine, blood, resp cx neg, on Hep for DVT ppx PLAN- -cont Vancomycin -cont Ciprofloxacin 400 mg IV q12h   Lab 06/01/11 0730 05/31/11 0509 05/30/11 0500 05/29/11 0430 05/28/11 0416  WBC 10.9* 12.2* 11.6* 8.2 9.6    Lab 06/02/11 0500 05/31/11 0748 05/27/11 0600  PROCALCITON 0.66 2.28 3.96    4. S/p left bunion surgery: pt with implanted wired in left foot left foot incision site c/d/i w/o signs of infection. XR left foot w/o definite bony changes or soft tissue air PLAN- -cont to monitor  Renal 5. Acute Kidney Injury:  Likely due to severe sepsis vs pre-renal from nausea and vomiting. Adequate response to Lasix, , diuresis well with Lasix with several liter net neg output, stable bp , creatinine has remainednormal. Plan -cont KVO  Lab 06/01/11 1152 06/01/11 0730 05/31/11 1600 05/31/11 0509 05/30/11 0500  CREATININE 0.65 0.62 0.71 0.72 0.72    Intake/Output Summary (Last 24 hours) at 06/02/11 0645 Last data filed at 06/02/11 0600  Gross per 24 hour  Intake 3132.41 ml  Output   7460 ml  Net -4327.59 ml    Endocrine 6. Hypernatremia: resolved, likely secondary to tube feeds and diuretic  Lab 06/02/11 0500 06/01/11 1152  06/01/11 0730 05/31/11 1600 05/31/11 0509  NA 140 144 144 147* 146*    Neurology 7. Delirium: unclear etiology, possibly narcortic or hypoxemia, pt without response to Narcan in ED, husband reports pt does not like to take pain medications, Acetaminophen level low, CT of head without acute findings Plan -Continue WUA -haldol prn rather than versed   Cardiovascular 8. Elevated Cardiac Enzymes: EKG with 1mm ST depression in lateral leads, likely secondary to demand ischemia, ECHO without acute findings, Cardiology consulted for further recommendations Plan -appreciate Cardiology recommendations, no further wu planned    Lab 05/28/11 1400 05/27/11 1100 05/27/11 0600  TROPONINI 1.03* 1.94* 0.53*    Gastroenterology 9. Nausea and Vomiting/ transaminitis: KUB w/o acute findings, on Tube Feeds, LFTs trending towards normal, ischemic hepatitis possible PLAN -continue to follow Hepatic function -continue Tylenol from q8h prn fever  Lab 05/31/11 0509 05/30/11 0500 05/29/11 0430 05/28/11 1400 05/27/11 0600  AST 46* 84* 187* 201* 140*  ALT 254* 359* 494* 401* 191*  ALKPHOS 83 61 48 45 79  BILITOT 0.5 0.5 0.6 0.4  0.4  PROT 5.6* 5.2* 5.1* 5.1* 7.1  ALBUMIN 1.9* 1.8* 2.0* 2.2* 3.7  INR -- -- -- -- 1.21    10. Nutrition: s/p Tube feeds PLAN -NPO for now  11.  Disposition: Full code, husband and family at bedside, informed of pt status and plans of care      Perry, Valerie 06/02/2011, 6:45 AM   Pt seen and examined and database reviewed. I agree with above findings, assessment and plan. Family updated in detail @ bedside. A little stridorous post extubation. Intractable cough post extubation - improved after lido neb  Billy Fischer, MD;  PCCM service; [place] [phone]

## 2011-06-02 NOTE — Procedures (Signed)
Extubation Procedure Note  Patient Details:   Name: Valerie Perry DOB: Mar 21, 1956 MRN: [id number]   Airway Documentation:  AIRWAYS 7.5 mm (Active)  Secured at (cm) 24 cm 05/27/2011  4:00 PM  Secured Location Left 05/27/2011  4:00 PM  Secured By [employer] 05/27/2011  4:00 PM  Site Condition Dry 05/27/2011  4:00 PM    Evaluation  O2 sats: stable throughout Complications: Complications of Expiratory Stridor with frequent dry cough-given Lidacaine/cool Aerosol Patient did tolerate procedure well. Bilateral Breath Sounds: Expiratory wheezes;Stridor (Stridor on Exhalation with frequent coarse/ cough) Suctioning: Airway Yes  Joylene John 06/02/2011, 12:33 PM

## 2011-06-02 NOTE — Progress Notes (Signed)
Clinical Social Worker completed psychosocial assessment, please see shadow chart for details.  CSW provided emotional support to pt and family.  Family did not cite any needs at this time.  CSW to continue to follow and assist as needed.   Angelia Mould, MSW, [place] [phone]

## 2011-06-03 ENCOUNTER — Inpatient Hospital Stay (HOSPITAL_COMMUNITY): Payer: BC Managed Care – PPO

## 2011-06-03 DIAGNOSIS — J984 Other disorders of lung: Secondary | ICD-10-CM

## 2011-06-03 DIAGNOSIS — J690 Pneumonitis due to inhalation of food and vomit: Secondary | ICD-10-CM

## 2011-06-03 LAB — CULTURE, RESPIRATORY W GRAM STAIN

## 2011-06-03 MED ORDER — LOPERAMIDE HCL 2 MG PO CAPS
2.0000 mg | ORAL_CAPSULE | ORAL | Status: DC | PRN
  Administered 2011-06-04 (×2): 2 mg via ORAL
  Filled 2011-06-03 (×2): qty 1

## 2011-06-03 MED ORDER — POTASSIUM CHLORIDE CRYS ER 20 MEQ PO TBCR: 40.0000 meq | EXTENDED_RELEASE_TABLET | Freq: Once | ORAL | Status: DC

## 2011-06-03 MED ORDER — CIPROFLOXACIN HCL 500 MG PO TABS
500.0000 mg | ORAL_TABLET | Freq: Two times a day (BID) | ORAL | Status: AC
Start: 2011-06-03 — End: 2011-06-05
  Administered 2011-06-03 – 2011-06-05 (×6): 500 mg via ORAL
  Filled 2011-06-03 (×9): qty 1

## 2011-06-03 MED ORDER — POTASSIUM CHLORIDE CRYS ER 20 MEQ PO TBCR
EXTENDED_RELEASE_TABLET | ORAL | Status: AC
  Filled 2011-06-03: qty 2

## 2011-06-03 NOTE — Evaluation (Signed)
Physical Therapy Evaluation Patient Details Name: Valerie Perry MRN: [id number] DOB: June 03, 1955 Today's Date: 06/03/2011  Problem List:  Patient Active Problem List  Diagnoses  . Aspiration pneumonia  . Respiratory failure  . Nausea and vomiting  . Severe sepsis  . Delirium  . AKI (acute kidney injury)  . Elevated troponin    Past Medical History:  Past Medical History  Diagnosis Date  . No pertinent past medical history    Past Surgical History:  Past Surgical History  Procedure Date  . Foot surgery     PT Assessment/Plan/Recommendation PT Assessment Clinical Impression Statement: Pt admitted with respiratory failure due to aspiration PNA, sepsis and s/p VDRF. Pt with generalized weakness and balance deficits from bedrest and medical course. Pt will benefit from therapy to maximize mobility and independence prior to discharge. PT Recommendation/Assessment: Patient will need skilled PT in the acute care venue PT Problem List: Decreased balance;Decreased mobility;Decreased activity tolerance;Decreased cognition;Decreased knowledge of use of DME Barriers to Discharge: None PT Therapy Diagnosis : Abnormality of gait;Difficulty walking PT Plan PT Frequency: Min 3X/week PT Treatment/Interventions: Gait training;DME instruction;Stair training;Functional mobility training;Balance training;Therapeutic exercise;Therapeutic activities;Patient/family education PT Recommendation Recommendations for Other Services: OT consult;Rehab consult Follow Up Recommendations: Inpatient Rehab (pending pt progress) Equipment Recommended: Defer to next venue PT Goals  Acute Rehab PT Goals PT Goal Formulation: With patient/family Time For Goal Achievement: 2 weeks Pt will go Supine/Side to Sit: with modified independence;with HOB 0 degrees PT Goal: Supine/Side to Sit - Progress: Goal set today Pt will go Sit to Supine/Side: with modified independence;with HOB 0 degrees PT Goal: Sit to Supine/Side -  Progress: Goal set today Pt will go Sit to Stand: with supervision PT Goal: Sit to Stand - Progress: Goal set today Pt will go Stand to Sit: with supervision PT Goal: Stand to Sit - Progress: Goal set today Pt will Ambulate: >150 feet;with least restrictive assistive device;with supervision PT Goal: Ambulate - Progress: Goal set today Pt will Go Up / Down Stairs: Flight;with min assist;with least restrictive assistive device PT Goal: Up/Down Stairs - Progress: Goal set today  PT Evaluation Precautions/Restrictions  Precautions Precautions: Fall Prior Functioning  Home Living Lives With: Spouse Type of Home: House Home Layout: Two level;Bed/bath upstairs Alternate Level Stairs-Rails: Right Alternate Level Stairs-Number of Steps: 12 Home Access: Level entry Bathroom Shower/Tub: Walk-in shower;Door [employer] Toilet: Standard Home Adaptive Equipment: Straight cane;Crutches Additional Comments: family available to assist 24 hrs/day Prior Function Level of Independence: Independent with basic ADLs;Independent with homemaking with ambulation;Independent with gait;Independent with transfers Driving: Yes Vocation: Works at home [employer] Arousal/Alertness: Awake/alert Overall Cognitive Status: Impaired Memory: Appears impaired Memory Deficits: Pt does not recall events preceeding or since admission.  Orientation Level: Oriented to person;Oriented to place;Oriented to time Cognition - Other Comments: Pt slightly anxious with mobility Sensation/Coordination Sensation Light Touch: Appears Intact Extremity Assessment RLE Assessment RLE Assessment: Within Functional Limits LLE Assessment LLE Assessment: Within Functional Limits Mobility (including Balance) Bed Mobility Bed Mobility: Yes Rolling Right: 4: Min assist Rolling Left: 4: Min assist;With rail Rolling Left Details (indicate cue type and reason): cues to sequence Left Sidelying to Sit: 4: Min assist;HOB  flat Left Sidelying to Sit Details (indicate cue type and reason): cues to sequence Sitting - Scoot to Edge of Bed: 4: Min assist Sitting - Scoot to [employer] of Bed Details (indicate cue type and reason): reciprocal scooting with pad and cues Transfers Transfers: Yes Sit to Stand: 1: +2 Total assist;From bed;Patient percentage (comment) (  pt=60%) Sit to Stand Details (indicate cue type and reason): cues for hand placement and sequence with assist for stability secondary to initial posterior lean Stand to Sit: 4: Min assist;To chair/3-in-1;With armrests Stand to Sit Details: cues for hand placement and safety to back fully to chair Ambulation/Gait Ambulation/Gait: Yes Ambulation/Gait Assistance: 1: +2 Total assist;Patient percentage (comment) (pt=70%) Ambulation/Gait Assistance Details (indicate cue type and reason): assist to direct and control RW pt with tendency to pull up on walker with posterior lean. Assist at pelvis to facilitate gait and balance Ambulation Distance (Feet): 15 Feet Assistive device: Rolling walker Gait Pattern: Decreased stride length Stairs: No  Posture/Postural Control Posture/Postural Control: No significant limitations Balance Balance Assessed: Yes Static Sitting Balance Static Sitting - Balance Support: Feet supported;Bilateral upper extremity supported Static Sitting - Level of Assistance: 5: Stand by assistance Static Sitting - Comment/# of Minutes: 5 Static Standing Balance Static Standing - Level of Assistance: 3: Mod assist Exercise    End of Session PT - End of Session Equipment Utilized During Treatment: Gait belt Activity Tolerance: Patient tolerated treatment well Patient left: in chair;with call bell in reach;with family/visitor present Nurse Communication: Mobility status for transfers;Mobility status for ambulation General Behavior During Session: [facility] for tasks performed Cognition: Impaired  Delorse Lek 06/03/2011, 12:49 PM  Toney Sang, PT [phone]

## 2011-06-03 NOTE — Progress Notes (Signed)
Clinical Social Worker met with pt and husband at bedside.  CSW provided emotional support.  CSW normalized feelings associated with hospitalizations (anxiety, frustration).  Pt and spouse were both excited that pt had "Step Down Orders" for today.  CSW to continue to follow and assist as needed.  Angelia Mould, MSW, [place] [phone]

## 2011-06-03 NOTE — Progress Notes (Signed)
ANTIBIOTIC CONSULT NOTE - FOLLOW UP  Pharmacy Consult for vancomycin Indication: Aspiration pneumonia/SIRS/sepsi  Admit Complaint: aspiration and respiratory failure; s/p foot surgery 1 week ago and doing well then developed flu-like symptoms (N&V) and husband found her minimally responsive.  Past Medical History: No pertinent past medical history   Anticoagulation: None PTA, heparin SQ for VTE proph  Infectious Disease: Vanc/cipro D#4 for possible aspiration PNA/sepsis, Zosyn dc'd (1/25 >>1/29 ), vanc d/c'ed previously since resp cx grew NPOF, UCx negative, BCx NGTD, resp cx NG. No new culture data. - Tmax 100.6, WBC 10.9 1/30, renal fxn stable - restarted vanc and added cipro 1/29 since pt is still febrile, drug fever?? (trough 1/30 = 19) Plan:  Continue cipro 400mg  IV q12h Continue Vancomcin 1000mg  IV q8 hours Plan on Vancomycin trough early next week unless renal function changes. F/up bld cx-05/31/11  Cardiovascular: BP 120/60s, HR 71 - no meds. Elevated troponins likely d/t demand ischemia per Cards, EF 65-70%, Cards signed off since doubt anything cardiovascular contributing.  Endocrinology: No DM, CBGs <160, no meds  Gastrointestinal / Nutrition: N/V flu symptoms recently, d/t ?gastroenteritis vs ileus vs SBO, off tube feeds currently. Recent stool-loose watery.  Neurology: off sedation after extubation  Nephrology: ARF resolved - SCr 0.57, CrCL 88 ml/min, uop 2.2L(66ml/kg/hr). NS@kvo . K-3.2-replaced.  Pulmonary: Extubated 1/31, albuterol nebs q6h Hematology / Oncology: hgb 9.3, plts 272  Best Practices: Heparin SQ, PPI PT, MC   Allergies  Allergen Reactions  . Codeine     Upset stomach    Patient Measurements: Height: 5\' 3"  (160 cm) Weight: 203 lb 14.8 oz (92.5 kg) IBW/kg (Calculated) : 52.4    Vital Signs: Temp: 99.3 F (37.4 C) (02/01 0813) BP: 131/69 mmHg (02/01 0800) Pulse Rate: 71  (02/01 0800) Intake/Output from previous day: 01/31 0701 - 02/01  0700 In: 1673.4 [I.V.:583.4; NG/GT:90; IV Piggyback:1000] Out: 2920 [Urine:2220; Stool:700] Intake/Output from this shift: Total I/O In: -  Out: 480 [Urine:155; Stool:325]  Labs:  Basename 06/02/11 0500 06/01/11 1152 06/01/11 0730  WBC -- -- 10.9*  HGB -- -- 9.3*  PLT -- -- 272  LABCREA -- -- --  CREATININE 0.57 0.65 0.62   Estimated Creatinine Clearance: 87.8 ml/min (by C-G formula based on Cr of 0.57).  Basename 06/01/11 1930  VANCOTROUGH 19.0  VANCOPEAK --  Drue Dun --  GENTTROUGH --  GENTPEAK --  GENTRANDOM --  TOBRATROUGH --  TOBRAPEAK --  TOBRARND --  AMIKACINPEAK --  AMIKACINTROU --  AMIKACIN --     Microbiology: Recent Results (from the past 720 hour(s))  URINE CULTURE     Status: Normal   Collection Time   05/27/11  6:06 AM      Component Value Range Status Comment   Specimen Description URINE, CATHETERIZED   Final    Special Requests NONE   Final    Culture  Setup Time [id number]   Final    Colony Count NO GROWTH   Final    Culture NO GROWTH   Final    Report Status 05/28/2011 FINAL   Final   CULTURE, BLOOD (ROUTINE X 2)     Status: Normal   Collection Time   05/27/11  6:10 AM      Component Value Range Status Comment   Specimen Description BLOOD RIGHT ARM   Final    Special Requests BOTTLES DRAWN AEROBIC ONLY [facility]   Final    Culture  Setup Time [id number]   Final    Culture NO GROWTH 5  DAYS   Final    Report Status 06/02/2011 FINAL   Final   CULTURE, BLOOD (ROUTINE X 2)     Status: Normal   Collection Time   05/27/11  6:30 AM      Component Value Range Status Comment   Specimen Description BLOOD LEFT ARM   Final    Special Requests BOTTLES DRAWN AEROBIC AND ANAEROBIC 5CC   Final    Culture  Setup Time [id number]   Final    Culture NO GROWTH 5 DAYS   Final    Report Status 06/02/2011 FINAL   Final   URINE CULTURE     Status: Normal   Collection Time   05/27/11  9:14 AM      Component Value Range Status Comment   Specimen Description  URINE, CATHETERIZED   Final    Special Requests NONE   Final    Culture  Setup Time [id number]   Final    Colony Count NO GROWTH   Final    Culture NO GROWTH   Final    Report Status 05/28/2011 FINAL   Final   MRSA PCR SCREENING     Status: Normal   Collection Time   05/27/11  9:14 AM      Component Value Range Status Comment   MRSA by PCR NEGATIVE  NEGATIVE  Final   CULTURE, RESPIRATORY     Status: Normal   Collection Time   05/27/11  9:52 AM      Component Value Range Status Comment   Specimen Description ENDOTRACHEAL   Final    Special Requests Immunocompromised   Final    Gram Stain     Final    Value: MODERATE WBC PRESENT, PREDOMINANTLY PMN     NO SQUAMOUS EPITHELIAL CELLS SEEN     RARE GRAM POSITIVE COCCI     IN PAIRS   Culture Non-Pathogenic Oropharyngeal-type Flora Isolated.   Final    Report Status 05/29/2011 FINAL   Final   URINE CULTURE     Status: Normal   Collection Time   05/31/11 12:35 PM      Component Value Range Status Comment   Specimen Description URINE, CATHETERIZED   Final    Special Requests PT ON ZOSYN   Final    Culture  Setup Time [id number]   Final    Colony Count NO GROWTH   Final    Culture NO GROWTH   Final    Report Status 06/01/2011 FINAL   Final   CULTURE, BLOOD (ROUTINE X 2)     Status: Normal (Preliminary result)   Collection Time   05/31/11  1:18 PM      Component Value Range Status Comment   Specimen Description BLOOD LEFT ARM   Final    Special Requests BOTTLES DRAWN AEROBIC AND ANAEROBIC 10CC   Final    Culture  Setup Time [id number]   Final    Culture     Final    Value:        BLOOD CULTURE RECEIVED NO GROWTH TO DATE CULTURE WILL BE HELD FOR 5 DAYS BEFORE ISSUING A FINAL NEGATIVE REPORT   Report Status PENDING   Incomplete   CULTURE, BLOOD (ROUTINE X 2)     Status: Normal (Preliminary result)   Collection Time   05/31/11  1:19 PM      Component Value Range Status Comment   Specimen Description BLOOD RIGHT ARM   Final     Special Requests  BOTTLES DRAWN AEROBIC AND ANAEROBIC 10CC   Final    Culture  Setup Time [id number]   Final    Culture     Final    Value:        BLOOD CULTURE RECEIVED NO GROWTH TO DATE CULTURE WILL BE HELD FOR 5 DAYS BEFORE ISSUING A FINAL NEGATIVE REPORT   Report Status PENDING   Incomplete   CULTURE, RESPIRATORY     Status: Normal (Preliminary result)   Collection Time   05/31/11  2:58 PM      Component Value Range Status Comment   Specimen Description ENDOTRACHEAL   Final    Special Requests NONE   Final    Gram Stain     Final    Value: MODERATE WBC PRESENT,BOTH PMN AND MONONUCLEAR     NO SQUAMOUS EPITHELIAL CELLS SEEN     NO ORGANISMS SEEN   Culture Non-Pathogenic Oropharyngeal-type Flora Isolated.   Final    Report Status PENDING   Incomplete   CLOSTRIDIUM DIFFICILE BY PCR     Status: Normal   Collection Time   06/01/11 11:45 AM      Component Value Range Status Comment   C difficile by pcr NEGATIVE  NEGATIVE  Final     Anti-infectives     Start     Dose/Rate Route Frequency Ordered Stop   05/31/11 1200   ciprofloxacin (CIPRO) IVPB 400 mg        400 mg 200 mL/hr over 60 Minutes Intravenous Every 12 hours 05/31/11 1046     05/31/11 1200   vancomycin (VANCOCIN) IVPB 1000 mg/200 mL premix        1,000 mg 200 mL/hr over 60 Minutes Intravenous Every 8 hours 05/31/11 1047     05/28/11 2200   vancomycin (VANCOCIN) IVPB 1000 mg/200 mL premix  Status:  Discontinued        1,000 mg 200 mL/hr over 60 Minutes Intravenous Every 12 hours 05/28/11 1101 05/29/11 0900   05/27/11 1500   piperacillin-tazobactam (ZOSYN) IVPB 3.375 g  Status:  Discontinued        3.375 g 12.5 mL/hr over 240 Minutes Intravenous Every 8 hours 05/27/11 0904 05/31/11 1046   05/27/11 1000   vancomycin (VANCOCIN) IVPB 1000 mg/200 mL premix  Status:  Discontinued        1,000 mg 200 mL/hr over 60 Minutes Intravenous Every 24 hours 05/27/11 0904 05/28/11 1101   05/27/11 0900   piperacillin-tazobactam (ZOSYN)  IVPB 3.375 g  Status:  Discontinued        3.375 g 100 mL/hr over 30 Minutes Intravenous  Once 05/27/11 0848 05/27/11 0858   05/27/11 0645   piperacillin-tazobactam (ZOSYN) IVPB 4.5 g  Status:  Discontinued        4.5 g 134 mL/hr over 30 Minutes Intravenous  Once 05/27/11 0636 05/27/11 0637   05/27/11 0645  piperacillin-tazobactam (ZOSYN) IVPB 3.375 g       3.375 g 12.5 mL/hr over 240 Minutes Intravenous  Once 05/27/11 0642 05/27/11 1045   05/27/11 0615   piperacillin-tazobactam (ZOSYN) IVPB 4.5 g  Status:  Discontinued        4.5 g 200 mL/hr over 30 Minutes Intravenous  Once 05/27/11 0603 05/27/11 0722   05/27/11 0615   vancomycin (VANCOCIN) 1,000 mg in sodium chloride 0.9 % 250 mL IVPB  Status:  Discontinued        1,000 mg 250 mL/hr over 60 Minutes Intravenous  Once 05/27/11 0603 05/27/11 8119  05/27/11 0615   vancomycin (VANCOCIN) IVPB 1000 mg/200 mL premix  Status:  Discontinued        1,000 mg 200 mL/hr over 60 Minutes Intravenous  Once 05/27/11 0609 05/27/11 0644         Severiano Gilbert 06/03/2011,8:26 AM

## 2011-06-03 NOTE — Progress Notes (Signed)
Called Dr. Gae Bon with [facility] in [place] [phone]).  Clarified that dressing changes can be weekly and that patient is allowed to be weight bearing on left leg with boot in place.  Valerie Perry 06/03/2011 2:24 PM

## 2011-06-03 NOTE — Progress Notes (Signed)
Name: Valerie Perry MRN: [id number] DOB: 1955-11-26    LOS: 7  PCCM Progress NOTE   Subjective: 56 y/o female with past medical history of recent bunion surgery, nausea, vomiting admitted 1/25 for aspiration pneumonia with ARDS and severe sepsis with shock.  Afebrile, doing well off of vent.  Is concerned about her weakness but would like to try to get out of bed.  Was allowed to walk with walking boot per podiatrist prior to admission.   Lines / Drains: 1/25 ETT >> 1/31 1/25 L subclavian CVL >> 06/03/11  Cultures: 1/25 blood >> neg 1/25 sputum >> pending 1/25 resp gm stain>> rare gm + cocci in prs 1/25 urine >> neg 1/25 procalc  5 -->> 3.9--> 2.28 (1/29)--> 0.66 (1/31) 1/25 lactic acid 5.4--> 2.8 --> 1.3 (1/29) 1/25 flu >> neg 1/29 blood >> ngtd >>> 1/29 Urine >> no growth 1/29 Resp Cx>> no organisms  Antibiotics: Zosyn 1/25 >> 1/29 Vanc 1/25 >>1/27, 1/29 >> 06/03/11 Cipro 1/29 >> change to PO 06/03/11, last dose scheduled for 06/05/11  Tests / Events: 1/25 head CT neg 1/25 Abd U/S  neg 1/25 CXR consolidation left lung apex with pulm vascular congestion 1/26 CXR slight improvement in bibasilar opacities c/w improving pneumonia 1/27 CXR no improvement in bibasilar opacities 1/28 CXR Persistent multilobar interstitial & airspace opacities, c/w multilobar pneumonia 1/29 CXR Significantly worsening bilateral diffuse lung opacities  1/29 XR left foot >>> No definite bony destruction is identified. There is no soft tissue gas. 1/30 CXR >>> improving patchy bilateral airspace disease 2/1   CXR >> unchanged  Vital Signs: Temp:  [89 F (31.7 C)-100.6 F (38.1 C)] 99.3 F (37.4 C) (02/01 0813) Pulse Rate:  [63-96] 75  (02/01 1200) Resp:  [23-40] 27  (02/01 1200) BP: (109-137)/(49-109) 122/59 mmHg (02/01 1200) SpO2:  [91 %-100 %] 97 % (02/01 1200) FiO2 (%):  [50 %] 50 % (02/01 0740) I/O last 3 completed shifts: In: 3158.6 [I.V.:1018.6; NG/GT:540; IV Piggyback:1600] Out: 4920  [Urine:4220; Stool:700]  Physical Examination: Gen: NAD HEENT: MMM  PULM: Slightly coarse bilaterally, good air movement throughout CV: RRR, no murmur AB: BS+, soft, nontender Ext: warm, 1+ edema bilateral LE and dorsum of hands, no clubbing, no cyanosis, s/p surgery L foot with bandages c/d/i, boot in place on left foot/LE   Labs and Imaging:   Basic Metabolic Panel:  Basename 06/02/11 0500 06/01/11 1152  NA 140 144  K 3.2* 3.5  CL 97 101  CO2 29 33*  GLUCOSE 161* 125*  BUN 26* 23  CREATININE 0.57 0.65  CALCIUM 8.8 9.1  MG -- --  PHOS -- --   CBC:  Basename 06/01/11 0730  WBC 10.9*  NEUTROABS --  HGB 9.3*  HCT 28.7*  MCV 93.2  PLT 272    CBG:  Basename 06/01/11 1953  GLUCAP 158*    Assessment and Plan: HD# 7 for 56 y/o female with recent bunion surgery, nausea, vomiting admitted for aspiration pneumonia with respiratory distress and severe sepsis with shock.  Respiratory 1. Aspiration Pneumonia:  Mildly febrile, on Day #3 of Vancomycin (previous 2 day course) and Ciprofloxacin , most recent CXR with improving edema not changed much from yesterday Plan- -d/c Vanc today, Cipro changed to po, last dose will be on 06/05/11 -recheck CXR on Monday 2/4 as pt clinically improving, less need for daily CXR monitoring  -continue Tylenol prn  2. Respiratory failure, hypoxic: improved and stable, with non-resolving ARDS likely secondary to aspiration pneumonia associated with  vomiting and delirium.   Plan -Pt did well overnight on Venturi mask, now on 6L  with good O2 sat -Continue weaning O2 as tolerated -d/c combivent inhaler -Fentanyl 12.5-25 q1h prn -Encourage OOB to chair -IS q1hr while awake   Infectious Disease 3. SIRS/sepsis, severe: currently afebrile, maintaining bp off pressors PLAN- -d/c Vancomycin 2/1 -cont Ciprofloxacin but will change to PO, last dose on 06/05/11   4. S/p left bunion surgery: pt with implanted wired in left foot left foot incision  site c/d/i w/o signs of infection. XR left foot w/o definite bony changes or soft tissue air PLAN -cont to monitor -will call her podiatrist to discuss appropriate PT as pt should be out of bed to help with ARDS  Renal 5. Acute Kidney Injury:  Likely due to severe sepsis vs pre-renal from nausea and vomiting. Cr normal Plan -cont KVO   Endocrine 6. Hypernatremia: resolved, likely secondary to tube feeds and diuretic   Neurology 7. Delirium: unclear etiology, possibly narcortic or hypoxemia, pt without response to Narcan in ED, husband reports pt does not like to take pain medications, Acetaminophen level low, CT of head without acute findings Plan -Continue WUA -haldol prn rather than versed -has clinically improved, will likely be helped by transfer to SDU    Cardiovascular 8. Elevated Cardiac Enzymes: EKG with 1mm ST depression in lateral leads, likely secondary to demand ischemia, ECHO without acute findings, Cardiology consulted for further recommendations Plan -appreciate Cardiology recommendations, no further wu planned    Gastroenterology 9. Nausea and Vomiting/ transaminitis: KUB w/o acute findings, LFTs trending towards normal, ischemic hepatitis possible PLAN -continue to follow Hepatic function -continue Tylenol from q8h prn fever  10. Nutrition: s/p Tube feeds PLAN -Full liquids, advance to carb mod diet as tolerated  11.  Disposition: Full code, husband and family at bedside, informed of pt status and plans of care  -Transfer to SDU today, consider txf to floor on 06/04/11 -Consider CIR for rehab once medically stable and decreased O2 requirement      Valerie Perry,Valerie Perry 06/03/2011, 12:26 PM   Pt seen and examined and database reviewed. I agree with above findings, assessment and plan  Valerie Fischer, MD;  PCCM service; Mobile [phone]

## 2011-06-04 LAB — CBC
Hemoglobin: 10.4 g/dL — ABNORMAL LOW (ref 12.0–15.0)
MCH: 29.9 pg (ref 26.0–34.0)
Platelets: 466 10*3/uL — ABNORMAL HIGH (ref 150–400)
RBC: 3.48 MIL/uL — ABNORMAL LOW (ref 3.87–5.11)
WBC: 14.7 10*3/uL — ABNORMAL HIGH (ref 4.0–10.5)

## 2011-06-04 LAB — COMPREHENSIVE METABOLIC PANEL
AST: 28 U/L (ref 0–37)
BUN: 21 mg/dL (ref 6–23)
CO2: 27 mEq/L (ref 19–32)
Calcium: 8.9 mg/dL (ref 8.4–10.5)
Chloride: 106 mEq/L (ref 96–112)
Creatinine, Ser: 0.71 mg/dL (ref 0.50–1.10)
GFR calc Af Amer: >90 mL/min (ref >90)
GFR calc non Af Amer: >90 mL/min (ref >90)
Total Bilirubin: 0.6 mg/dL (ref 0.3–1.2)

## 2011-06-04 MED ORDER — TEMAZEPAM 15 MG PO CAPS
15.0000 mg | ORAL_CAPSULE | Freq: Every evening | ORAL | Status: DC | PRN
  Administered 2011-06-04: 15 mg via ORAL
  Filled 2011-06-04: qty 1

## 2011-06-04 MED ORDER — BIOTENE DRY MOUTH MT LIQD
15.0000 mL | Freq: Two times a day (BID) | OROMUCOSAL | Status: DC
  Administered 2011-06-04 – 2011-06-06 (×5): 15 mL via OROMUCOSAL

## 2011-06-04 NOTE — Progress Notes (Signed)
Name: Valerie Perry MRN: [id number] DOB: 1955-08-30    LOS: 8  PCCM Progress NOTE   Subjective: 56 y/o female with past medical history of recent bunion surgery, nausea, vomiting admitted 1/25 for aspiration pneumonia with ARDS and severe sepsis with shock. Afebrile, doing well off of vent.  Is concerned about her weakness but would like to try to get out of bed.  Was allowed to walk with walking boot per podiatrist prior to admission.   Today- Up in chair. She and husband had appropriate questions about status and prognosis. Breathing better. She didn't realize she had aspirated. Had prolonged diarrhea now may be resolved. Insomnia - little sleep last 2 nights. Permission to sleep in chair.  Lines / Drains: 1/25 ETT >> 1/31 1/25 L subclavian CVL >> 06/03/11  Cultures: 1/25 blood >> neg 1/25 sputum >> pending 1/25 resp gm stain>> rare gm + cocci in prs 1/25 urine >> neg 1/25 procalc  5 -->> 3.9--> 2.28 (1/29)--> 0.66 (1/31) 1/25 lactic acid 5.4--> 2.8 --> 1.3 (1/29) 1/25 flu >> neg 1/29 blood >> ngtd >>> 1/29 Urine >> no growth 1/29 Resp Cx>> no organisms  Antibiotics: Zosyn 1/25 >> 1/29 Vanc 1/25 >>1/27, 1/29 >> 06/03/11 Cipro 1/29 >> change to PO 06/03/11, last dose scheduled for 06/05/11  Tests / Events: 1/25 head CT neg 1/25 Abd U/S  neg 1/25 CXR consolidation left lung apex with pulm vascular congestion 1/26 CXR slight improvement in bibasilar opacities c/w improving pneumonia 1/27 CXR no improvement in bibasilar opacities 1/28 CXR Persistent multilobar interstitial & airspace opacities, c/w multilobar pneumonia 1/29 CXR Significantly worsening bilateral diffuse lung opacities  1/29 XR left foot >>> No definite bony destruction is identified. There is no soft tissue gas. 1/30 CXR >>> improving patchy bilateral airspace disease 2/1   CXR >> unchanged  Vital Signs: Temp:  [97.8 F (36.6 C)-99.5 F (37.5 C)] 97.8 F (36.6 C) (02/02 0730) Pulse Rate:  [79-83] 81  (02/02  1256) Resp:  [20-31] 23  (02/02 1256) BP: (113-152)/(56-89) 119/60 mmHg (02/02 1256) SpO2:  [96 %-100 %] 99 % (02/02 1347) Weight:  [91.2 kg (201 lb 1 oz)] 91.2 kg (201 lb 1 oz) (02/02 0500) I/O last 3 completed shifts: In: 1403.3 [P.O.:540; I.V.:263.3; IV Piggyback:600] Out: 1930 [Urine:1605; Stool:325]  Physical Examination: Gen: NAD, up in chair HEENT: MMM, quiet voice w/o stridor PULM: Slightly coarse bilaterally, good air movement throughout, 97% on nasal O2 CV: RRR, no murmur AB: BS+, soft, nontender Ext: warm, 1+ edema bilateral LE and dorsum of hands, no clubbing, no cyanosis, s/p surgery L foot with bandages c/d/i, boot in place on left foot/LE   Labs and Imaging:   Basic Metabolic Panel:  Basename 06/04/11 0600 06/02/11 0500  NA 142 140  K 3.2* 3.2*  CL 106 97  CO2 27 29  GLUCOSE 109* 161*  BUN 21 26*  CREATININE 0.71 0.57  CALCIUM 8.9 8.8  MG -- --  PHOS -- --   CBC:  Basename 06/04/11 0600  WBC 14.7*  NEUTROABS --  HGB 10.4*  HCT 32.0*  MCV 92.0  PLT 466*    CBG:  Basename 06/01/11 1953  GLUCAP 158*    Assessment and Plan: HD# 7 for 56 y/o female with recent bunion surgery, nausea, vomiting admitted for aspiration pneumonia with respiratory distress and severe sepsis with shock.  Respiratory 1. Aspiration Pneumonia:  Mildly febrile, on Day #3 of Vancomycin (previous 2 day course) and Ciprofloxacin , most recent CXR with  improving edema not changed much from yesterday Plan- -Vanc dc'd.  Cipro changed to po, last dose will be on 06/05/11 -recheck CXR on Monday 2/4 as pt clinically improving, less need for daily CXR monitoring  -continue Tylenol prn  2. Respiratory failure, hypoxic: improved and stable, with non-resolving ARDS likely secondary to aspiration pneumonia associated with vomiting and delirium.   Plan -Pt did well overnight on Venturi mask, now on 6L [place] with good O2 sat -Continue weaning O2 as tolerated -d/c combivent inhaler -Fentanyl  12.5-25 q1h prn -Encourage OOB to chair -IS q1hr while awake   Infectious Disease 3. SIRS/sepsis, severe: currently afebrile, maintaining bp off pressors PLAN- -d/c Vancomycin 2/1 -cont Ciprofloxacin but will change to PO, last dose on 06/05/11   4. S/p left bunion surgery: pt with implanted wired in left foot left foot incision site c/d/i w/o signs of infection. XR left foot w/o definite bony changes or soft tissue air PLAN -cont to monitor -will call her podiatrist to discuss appropriate PT as pt should be out of bed to help with ARDS  Renal 5. Acute Kidney Injury:  Likely due to severe sepsis vs pre-renal from nausea and vomiting. Cr normal Plan -cont KVO   Endocrine 6. Hypernatremia: resolved, likely secondary to tube feeds and diuretic   Neurology 7. Delirium: unclear etiology, possibly narcortic or hypoxemia, pt without response to Narcan in ED, husband reports pt does not like to take pain medications, Acetaminophen level low, CT of head without acute findings Plan -Continue WUA -haldol prn rather than versed -has clinically improved, will likely be helped by transfer to SDU  - ok to allow low dose temazepam for sleep   Cardiovascular 8. Elevated Cardiac Enzymes: EKG with 1mm ST depression in lateral leads, likely secondary to demand ischemia, ECHO without acute findings, Cardiology consulted for further recommendations Plan -appreciate Cardiology recommendations, no further wu planned    Gastroenterology 9. Nausea and Vomiting/ transaminitis: KUB w/o acute findings, LFTs trending towards normal, ischemic hepatitis possible PLAN -continue to follow Hepatic function -continue Tylenol from q8h prn fever - watch status after recent diarrhea-resolved?  10. Nutrition: s/p Tube feeds PLAN -Full liquids, advance to carb mod diet as tolerated  11.  Disposition: Full code, husband and family at bedside, informed of pt status and plans of care  -Transfer to SDU today,  consider txf to floor on 06/04/11 -Consider CIR for rehab once medically stable and decreased O2 requirement      YOUNG,CLINTON D 06/04/2011, 2:12 PM

## 2011-06-05 DIAGNOSIS — J9600 Acute respiratory failure, unspecified whether with hypoxia or hypercapnia: Secondary | ICD-10-CM

## 2011-06-05 MED ORDER — TEMAZEPAM 15 MG PO CAPS
30.0000 mg | ORAL_CAPSULE | Freq: Every evening | ORAL | Status: DC | PRN
  Administered 2011-06-05: 30 mg via ORAL
  Filled 2011-06-05: qty 2

## 2011-06-05 NOTE — Progress Notes (Signed)
Name: Valerie Perry MRN: [id number] DOB: [birth year]    LOS: 9  PCCM Progress NOTE   Subjective: 56 y/o female with past medical history of recent bunion surgery, nausea, vomiting admitted 1/25 for aspiration pneumonia with ARDS and severe sepsis with shock. Afebrile, doing well off of vent.  Is concerned about her weakness but would like to try to get out of bed.  Was allowed to walk with walking boot per podiatrist prior to admission.   Today- Up in chair. Clearly feeling stronger. Visitors here- she is unlabored with speech. No new concerns expressed.  Lines / Drains: 1/25 ETT >> 1/31 1/25 L subclavian CVL >> 06/03/11  Cultures: 1/25 blood >> neg 1/25 sputum >> pending 1/25 resp gm stain>> rare gm + cocci in prs 1/25 urine >> neg 1/25 procalc  5 -->> 3.9--> 2.28 (1/29)--> 0.66 (1/31) 1/25 lactic acid 5.4--> 2.8 --> 1.3 (1/29) 1/25 flu >> neg 1/29 blood >> ngtd >>> 1/29 Urine >> no growth 1/29 Resp Cx>> no organisms  Antibiotics: Zosyn 1/25 >> 1/29 Vanc 1/25 >>1/27, 1/29 >> 06/03/11 Cipro 1/29 >> change to PO 06/03/11, last dose scheduled for 06/05/11  Tests / Events: 1/25 head CT neg 1/25 Abd U/S  neg 1/25 CXR consolidation left lung apex with pulm vascular congestion 1/26 CXR slight improvement in bibasilar opacities c/w improving pneumonia 1/27 CXR no improvement in bibasilar opacities 1/28 CXR Persistent multilobar interstitial & airspace opacities, c/w multilobar pneumonia 1/29 CXR Significantly worsening bilateral diffuse lung opacities  1/29 XR left foot >>> No definite bony destruction is identified. There is no soft tissue gas. 1/30 CXR >>> improving patchy bilateral airspace disease 2/1   CXR >> unchanged  Vital Signs: Temp:  [97.9 F (36.6 C)-98.6 F (37 C)] 98.4 F (36.9 C) (02/03 0818) Pulse Rate:  [70-85] 70  (02/03 0332) Resp:  [23] 23  (02/03 0332) BP: (116-124)/(60-70) 116/62 mmHg (02/03 0332) SpO2:  [94 %-99 %] 99 % (02/03 0818) Weight:  [86.8 kg (191 lb  5.8 oz)] 86.8 kg (191 lb 5.8 oz) (02/03 0500) I/O last 3 completed shifts: In: 1020 [P.O.:1020] Out: 1050 [Urine:1050]  Physical Examination: Gen: NAD, up in chair HEENT: MMM, quiet voice w/o stridor PULM: Slightly coarse bilaterally, good air movement throughout, 96% on nasal O2 CV: RRR, no murmur AB: BS+, soft, nontender Ext: warm, 1+ edema bilateral LE and dorsum of hands, no clubbing, no cyanosis, s/p surgery L foot with bandages c/d/i, boot in place on left foot/LE   Labs and Imaging:   Basic Metabolic Panel:  Basename 06/04/11 0600  NA 142  K 3.2*  CL 106  CO2 27  GLUCOSE 109*  BUN 21  CREATININE 0.71  CALCIUM 8.9  MG --  PHOS --   CBC:  Basename 06/04/11 0600  WBC 14.7*  NEUTROABS --  HGB 10.4*  HCT 32.0*  MCV 92.0  PLT 466*    CBG: No results found for this basename: GLUCAP:6 in the last 72 hours  Assessment and Plan: HD# 7 for 56 y/o female with recent bunion surgery, nausea, vomiting admitted for aspiration pneumonia with respiratory distress and severe sepsis with shock.  Respiratory 1. Aspiration Pneumonia:  Mildly febrile, on Day #3 of Vancomycin (previous 2 day course) and Ciprofloxacin , most recent CXR with improving edema not changed much from yesterday Plan- -Vanc dc'd.  Cipro changed to po, last dose will be on 06/05/11 -recheck CXR on Monday 2/4 as pt clinically improving, less need for daily CXR monitoring-  ordered  -continue Tylenol prn  2. Respiratory failure, hypoxic: improved and stable, with non-resolving ARDS likely secondary to aspiration pneumonia associated with vomiting and delirium.   Plan -Pt did well overnight on Venturi mask, now on 6L [place] with good O2 sat -Continue weaning O2 as tolerated -d/c combivent inhaler -Fentanyl 12.5-25 q1h prn -Encourage OOB to chair -IS q1hr while awake   Infectious Disease 3. SIRS/sepsis, severe: currently afebrile, maintaining bp off pressors PLAN- -d/c Vancomycin 2/1 -cont Ciprofloxacin  but will change to PO, last dose on 06/05/11   4. S/p left bunion surgery: pt with implanted wired in left foot left foot incision site c/d/i w/o signs of infection. XR left foot w/o definite bony changes or soft tissue air PLAN -cont to monitor -will call her podiatrist to discuss appropriate PT as pt should be out of bed to help with ARDS  Renal 5. Acute Kidney Injury:  Likely due to severe sepsis vs pre-renal from nausea and vomiting. Cr normal Plan -cont KVO   Endocrine 6. Hypernatremia: resolved, likely secondary to tube feeds and diuretic   Neurology 7. Delirium: unclear etiology, possibly narcortic or hypoxemia, pt without response to Narcan in ED, husband reports pt does not like to take pain medications, Acetaminophen level low, CT of head without acute findings Plan -Continue WUA -haldol prn rather than versed -has clinically improved, will likely be helped by transfer to SDU  - ok to allow low dose temazepam for sleep   Cardiovascular 8. Elevated Cardiac Enzymes: EKG with 1mm ST depression in lateral leads, likely secondary to demand ischemia, ECHO without acute findings, Cardiology consulted for further recommendations Plan -appreciate Cardiology recommendations, no further wu planned    Gastroenterology 9. Nausea and Vomiting/ transaminitis: KUB w/o acute findings, LFTs trending towards normal, ischemic hepatitis possible PLAN -continue to follow Hepatic function -continue Tylenol from q8h prn fever - watch status after recent diarrhea-resolved?  10. Nutrition: s/p Tube feeds PLAN -Full liquids, advance to carb mod diet as tolerated  11.  Disposition: Full code, husband and family at bedside, informed of pt status and plans of care  -Transfer to SDU today, consider txf to floor on 06/04/11 -Consider CIR for rehab once medically stable and decreased O2 requirement      Alfa Leibensperger D 06/05/2011, 12:06 PM

## 2011-06-06 ENCOUNTER — Inpatient Hospital Stay (HOSPITAL_COMMUNITY): Payer: BC Managed Care – PPO

## 2011-06-06 DIAGNOSIS — N179 Acute kidney failure, unspecified: Secondary | ICD-10-CM

## 2011-06-06 LAB — CULTURE, BLOOD (ROUTINE X 2)
Culture  Setup Time: 201301291624
Culture: NO GROWTH

## 2011-06-06 NOTE — Discharge Summary (Signed)
Physician Discharge Summary  Patient ID: Valerie Perry MRN: [id number] DOB/AGE: 54/18/1957 54 y.o.  Admit date: 05/27/2011 Discharge date: 06/06/2011    Discharge Diagnoses:  Active Problems:  Aspiration pneumonia  Respiratory failure  Nausea and vomiting  Severe sepsis  Delirium  AKI (acute kidney injury)  Elevated troponin   Brief Summary: Valerie Perry is a 56 y.o. y/o female with a PMH of recent bunion surgery and GI illness with significant nausea and vomiting.  Presented to [facility] ED 1/25 after she was found minimally responsive with emesis all over her face.  Intubated in ER and admitted with acute respiratory failure and aspiration PNA.  Her course has been complicated by ARDS, acute renal failure, severe sepsis and shock.  Treated with IV abx, EGDT for septic shock, brief pressors, fluids.  Extubated 1/31 and has progressed quickly post extubation.  At time of d/c is doing well on RA and will d/c home with hhpt.  See below for specific dates, cultures, abx courses, sepsis markers and diagnostic studies.      Aspiration pneumonia -- In setting significant emesis. See below for abx courses. Will f/u CXR as outpt.    Respiratory failure -- r/t above.  Resolved at d/c.   Nausea and vomiting -- KUB w/o acute findings, LFTs trending towards normal, ischemic hepatitis possible.   Severe sepsis -- in setting severe aspiration PNA.  Treated with EGDT. See below for abx, sepsis markers.   Delirium -- resolved. Unclear etiology, possible narcortic or hypoxemia. Sedation protocol on vent.   AKI (acute kidney injury) -- Resolved at d/c. Likely due to severe sepsis vs pre-renal from nausea and vomiting.  Elevated troponin--EKG with 1mm ST depression in lateral leads, likely secondary to demand ischemia, ECHO without acute findings, Cardiology consulted for further recommendations.  No further w/u planned.  S/p L bunion surgery -- pt with implanted wired in left foot left foot incision site c/d/i w/o  signs of infection. XR left foot w/o definite bony changes or soft tissue air.  Will f/u with podiatry within the week at d/c. Weekly dsg change.    Lines / Drains:  1/25 ETT >> 1/31  1/25 L subclavian CVL >> 06/03/11   Cultures:  1/25 blood >> neg  1/25 sputum >> pending  1/25 resp gm stain>> rare gm + cocci in prs >>> normal flora 1/25 urine >> neg  1/25 procalc 5 -->> 3.9--> 2.28 (1/29)--> 0.66 (1/31)  1/25 lactic acid 5.4--> 2.8 --> 1.3 (1/29)  1/25 flu >> neg  1/29 blood >> ngtd >>>  1/29 Urine >> no growth  1/29 Resp Cx>> no organisms   Antibiotics:  Zosyn 1/25 >> 1/29  Vanc 1/25 >>1/27, 1/29 >> 06/03/11  Cipro 1/29 >> change to PO 06/03/11, last dose 06/05/11   Tests / Events:  1/25 head CT neg  1/25 Abd U/S neg  1/25 CXR consolidation left lung apex with pulm vascular congestion  1/26 CXR slight improvement in bibasilar opacities c/w improving pneumonia  1/27 CXR no improvement in bibasilar opacities  1/28 CXR Persistent multilobar interstitial & airspace opacities, c/w multilobar pneumonia  1/29 CXR Significantly worsening bilateral diffuse lung opacities  1/29 XR left foot >>> No definite bony destruction is identified. There is no soft tissue gas.  1/30 CXR >>> improving patchy bilateral airspace disease    Discharge Labs  BMET  Lab 06/04/11 0600 06/02/11 0500 06/01/11 1152 06/01/11 0730 05/31/11 1600  NA 142 140 144 144 147*  K 3.2*  3.2* -- -- --  CL 106 97 101 104 105  CO2 27 29 33* 31 33*  GLUCOSE 109* 161* 125* 112* 109*  BUN 21 26* 23 22 24*  CREATININE 0.71 0.57 0.65 0.62 0.71  CALCIUM 8.9 8.8 9.1 8.7 8.6  MG -- -- -- -- --  PHOS -- -- -- -- --     CBC   Lab 06/04/11 0600 06/01/11 0730 05/31/11 0509  HGB 10.4* 9.3* 9.1*  HCT 32.0* 28.7* 28.2*  WBC 14.7* 10.9* 12.2*  PLT 466* 272 192   Anti-Coagulation No results found for this basename: INR:5 in the last 168 hours    Discharge Orders    Future Appointments: Provider: Department: Dept  Phone: Center:   06/16/2011 11:30 AM Rubye Oaks, NP [facility] [phone] None       Follow-up Information    Follow up with [facility], NP on 06/16/2011. (11:30am )    Contact information:   [facility] [address] [place] [address] [phone]       Follow up with JOBE,DANIEL B., MD. Call in 1 week.   Contact information:   [address] [place] [address] [phone]       Follow up with Cindra Presume, DPM on 06/10/2011. (9:45am )    Contact information:   [address] [facility] [phone]            Valerie Perry, Valerie Perry  Home Medication Instructions [id number]   Printed on:06/06/11 1132  Medication Information                    promethazine (PHENERGAN) 25 MG tablet Take 25 mg by mouth every 6 (six) hours as needed. For nausea           oxyCODONE (ROXICODONE) 15 MG immediate release tablet Take 15 mg by mouth every 4 (four) hours as needed. For pain           estradiol (VIVELLE-DOT) 0.0375 MG/24HR Place 1 patch onto the skin 2 (two) times a week.           progesterone (PROMETRIUM) 100 MG capsule Take 100 mg by mouth at bedtime.           Vitamin D, Ergocalciferol, (DRISDOL) 50000 UNITS CAPS Take 50,000 Units by mouth every 7 (seven) days.             Disposition: Home with HHPT  Discharged Condition: Valerie Perry has met maximum benefit of inpatient care and is medically stable and cleared for discharge.  Patient is pending follow up as above.      Time spent on disposition:  Greater than 35 minutes.   SignedDanford Bad, NP 06/06/2011  11:33 AM Pager: [phone]  *Care during the described time interval was provided by me and/or other providers on the critical care team. I have reviewed this patient's available data, including medical history, events of note, physical examination and test results as part of my evaluation.     Pt seen and examined and database reviewed. I  agree with above findings, assessment and plan  Billy Fischer, MD;  PCCM service; Mobile [phone]

## 2011-06-06 NOTE — Progress Notes (Signed)
Physical Therapy Treatment Patient Details Name: Valerie Perry MRN: [id number] DOB: Oct 19, 1955 Today's Date: 06/06/2011  PT Assessment/Plan  PT - Assessment/Plan Comments on Treatment Session: Patient presents with improved functional activity tolerance and capacity PT Plan: Discharge plan needs to be updated PT Frequency: Min 3X/week Follow Up Recommendations: Home health PT;Supervision/Assistance - 24 hour Equipment Recommended: Rolling walker with 5" wheels PT Goals  Acute Rehab PT Goals PT Goal: Sit to Stand - Progress: Met PT Goal: Stand to Sit - Progress: Met PT Goal: Ambulate - Progress: Progressing toward goal  PT Treatment Precautions/Restrictions  Precautions Precautions: Fall Required Braces or Orthoses: Yes Other Brace/Splint: walking boot left foot Restrictions Weight Bearing Restrictions: No Other Position/Activity Restrictions: None if in walking boot for left foot Mobility (including Balance) Transfers Sit to Stand: 5: Supervision;With upper extremity assist;From chair/3-in-1 Sit to Stand Details (indicate cue type and reason): Correct hand placement without cues - stand by assistance for safety only Stand to Sit: To chair/3-in-1;With upper extremity assist;5: Supervision Stand to Sit Details: Good control of descent - stand by assistance for safety only Ambulation/Gait Ambulation/Gait Assistance: 4: Min assist Ambulation/Gait Assistance Details (indicate cue type and reason): Min-guard assistance. Verbal cues for breathing control secnodary to tendency to mouth breathe.  Ambulation Distance (Feet): 120 Feet Assistive device: Rolling walker Gait Pattern: Within Functional Limits Gait velocity: Decreased velocity secondary to decreased functional capacity leading to slower speed to maintain better breathing control.  Posture/Postural Control Posture/Postural Control: No significant limitations Exercise  Other Exercises Other Exercises: Educated in use of  incentive spirometer Other Exercises: Educated in breathing control and positioning for breathlessness Other Exercises: Educated in seated marching and LAQ exercises to repeat hourly x 20 in chair End of Session PT - End of Session Equipment Utilized During Treatment: Gait belt Activity Tolerance: Patient tolerated treatment well (on 2l O2 with gait as pulse ox not registering and secondary to increased respiration rate 20's ensured patient on O2 - post gait SaO2 97%) Patient left: in chair Nurse Communication: Mobility status for ambulation General Behavior During Session: [facility] for tasks performed Cognition: [facility] for tasks performed  Edwyna Perfect, PT  Pager [phone] 06/06/2011, 8:41 AM

## 2011-06-06 NOTE — Progress Notes (Signed)
MD order to d/c pt home, pt VSS, D/C instructions given to pt and family, all questions answered, pt and family verbalized understanding of D/C and instructions

## 2011-06-06 NOTE — Progress Notes (Signed)
   CARE MANAGEMENT NOTE 06/06/2011  Patient:  Valerie Perry, Valerie Perry   Account Number:  [id number]  Date Initiated:  05/27/2011  Documentation initiated by:  [facility]  Subjective/Objective Assessment:   Found in down, vomit all over - intubated.     Action/Plan:   PTA, PT INDEPENDENT, LIVES WITH HUSBAND.  HISTORY OF RECENT FOOT SURGERY.   Anticipated DC Date:  06/09/2011   Anticipated DC Plan:  HOME W HOME HEALTH SERVICES      DC Planning Services  CM consult      [facility] Choice  HOME HEALTH   Choice offered to / List presented to:  C-1 Patient   DME arranged  Levan Hurst      DME agency  [facility]     HH arranged  HH-2 PT      [facility] agency  [facility]   Status of service:  Completed, signed off Medicare Important Message given?   (If response is "NO", the following Medicare IM given date fields will be blank) Date Medicare IM given:   Date Additional Medicare IM given:    Discharge Disposition:  HOME W HOME HEALTH SERVICES  Per UR Regulation:  Reviewed for med. necessity/level of care/duration of stay  Comments:  06/06/11 Erice Ahles,RN,BSN 1215 PT FOR DISCHARGE HOME TODAY.  NEEDS HHPT AND RW.  REFERRAL TO [facility], PER CHOICE.  START OF CARE 24-48H POST DC DATE. HUSBAND TO PROVIDE 24HR CARE AT DISCHARGE. Phone #[phone]   06-02-11 9am Avie Arenas, RNBSN - [phone] UR completed- remains intubated - agitated - on IV propofol.  05-30-11 12:15pm Johny Shears - [phone] UR completed.  Remains intubated - possibly extubate today.  05-27-11 9:15am Avie Arenas, RNBSN [phone] UR completed.

## 2011-06-09 NOTE — Progress Notes (Signed)
UR Completed.  Perris Conwell Jane [phone] 06/09/2011  

## 2011-06-16 ENCOUNTER — Inpatient Hospital Stay: Payer: BC Managed Care – PPO | Admitting: Adult Health

## 2012-05-21 ENCOUNTER — Other Ambulatory Visit: Payer: Self-pay | Admitting: Internal Medicine

## 2012-05-21 DIAGNOSIS — E559 Vitamin D deficiency, unspecified: Secondary | ICD-10-CM

## 2012-05-29 ENCOUNTER — Ambulatory Visit
Admission: RE | Admit: 2012-05-29 | Discharge: 2012-05-29 | Disposition: A | Discharge status: Home / Self Care | Payer: BC Managed Care – PPO | Source: Ambulatory Visit | Attending: Internal Medicine | Admitting: Internal Medicine

## 2012-05-29 DIAGNOSIS — E559 Vitamin D deficiency, unspecified: Secondary | ICD-10-CM

## 2014-05-08 ENCOUNTER — Ambulatory Visit
Admission: RE | Admit: 2014-05-08 | Discharge: 2014-05-08 | Disposition: A | Discharge status: Home / Self Care | Payer: BLUE CROSS/BLUE SHIELD | Source: Ambulatory Visit | Attending: Family Medicine | Admitting: Family Medicine

## 2014-05-08 ENCOUNTER — Other Ambulatory Visit: Payer: Self-pay | Admitting: Family Medicine

## 2014-05-08 DIAGNOSIS — M25552 Pain in left hip: Principal | ICD-10-CM

## 2014-05-08 DIAGNOSIS — M25551 Pain in right hip: Secondary | ICD-10-CM

## 2014-05-08 DIAGNOSIS — M545 Low back pain: Secondary | ICD-10-CM

## 2015-07-14 ENCOUNTER — Ambulatory Visit (INDEPENDENT_AMBULATORY_CARE_PROVIDER_SITE_OTHER): Payer: BLUE CROSS/BLUE SHIELD | Admitting: Podiatry

## 2015-07-14 ENCOUNTER — Encounter: Payer: Self-pay | Admitting: Podiatry

## 2015-07-14 DIAGNOSIS — M21619 Bunion of unspecified foot: Secondary | ICD-10-CM | POA: Insufficient documentation

## 2015-07-14 NOTE — Patient Instructions (Signed)
Patient came in with questions on surgeries done by Dr. Gae BonVito. All questions answered.

## 2015-07-14 NOTE — Progress Notes (Signed)
Has had surgery done by Dr. Gae BonVito in 2012 and 2013. Patient came in to have some questions answered about Dr. Gae BonVito.  All questions answered.

## 2015-09-18 DIAGNOSIS — J029 Acute pharyngitis, unspecified: Secondary | ICD-10-CM | POA: Diagnosis not present

## 2015-09-30 DIAGNOSIS — S83412A Sprain of medial collateral ligament of left knee, initial encounter: Secondary | ICD-10-CM | POA: Diagnosis not present

## 2015-09-30 DIAGNOSIS — M25562 Pain in left knee: Secondary | ICD-10-CM | POA: Diagnosis not present

## 2015-09-30 DIAGNOSIS — S83411A Sprain of medial collateral ligament of right knee, initial encounter: Secondary | ICD-10-CM | POA: Diagnosis not present

## 2015-09-30 DIAGNOSIS — M25561 Pain in right knee: Secondary | ICD-10-CM | POA: Diagnosis not present

## 2015-11-06 DIAGNOSIS — J029 Acute pharyngitis, unspecified: Secondary | ICD-10-CM | POA: Diagnosis not present

## 2015-12-03 DIAGNOSIS — M79672 Pain in left foot: Secondary | ICD-10-CM | POA: Diagnosis not present

## 2015-12-21 DIAGNOSIS — M7742 Metatarsalgia, left foot: Secondary | ICD-10-CM | POA: Diagnosis not present

## 2015-12-21 DIAGNOSIS — M2042 Other hammer toe(s) (acquired), left foot: Secondary | ICD-10-CM | POA: Diagnosis not present

## 2015-12-24 DIAGNOSIS — Z01419 Encounter for gynecological examination (general) (routine) without abnormal findings: Secondary | ICD-10-CM | POA: Diagnosis not present

## 2015-12-24 DIAGNOSIS — Z6834 Body mass index (BMI) 34.0-34.9, adult: Secondary | ICD-10-CM | POA: Diagnosis not present

## 2016-01-27 DIAGNOSIS — M7742 Metatarsalgia, left foot: Secondary | ICD-10-CM | POA: Diagnosis not present

## 2016-01-27 DIAGNOSIS — M21612 Bunion of left foot: Secondary | ICD-10-CM | POA: Diagnosis not present

## 2016-01-27 DIAGNOSIS — M2042 Other hammer toe(s) (acquired), left foot: Secondary | ICD-10-CM | POA: Diagnosis not present

## 2016-01-27 DIAGNOSIS — M79672 Pain in left foot: Secondary | ICD-10-CM | POA: Diagnosis not present

## 2016-02-23 ENCOUNTER — Emergency Department (HOSPITAL_COMMUNITY)
Admission: EM | Admit: 2016-02-23 | Discharge: 2016-02-24 | Disposition: A | Discharge status: Home / Self Care | Payer: BLUE CROSS/BLUE SHIELD | Attending: Emergency Medicine | Admitting: Emergency Medicine

## 2016-02-23 ENCOUNTER — Encounter (HOSPITAL_COMMUNITY): Payer: Self-pay

## 2016-02-23 DIAGNOSIS — T8484XA Pain due to internal orthopedic prosthetic devices, implants and grafts, initial encounter: Secondary | ICD-10-CM | POA: Diagnosis not present

## 2016-02-23 DIAGNOSIS — G8918 Other acute postprocedural pain: Secondary | ICD-10-CM | POA: Insufficient documentation

## 2016-02-23 DIAGNOSIS — M79672 Pain in left foot: Secondary | ICD-10-CM | POA: Insufficient documentation

## 2016-02-23 DIAGNOSIS — M2012 Hallux valgus (acquired), left foot: Secondary | ICD-10-CM | POA: Diagnosis not present

## 2016-02-23 DIAGNOSIS — M2042 Other hammer toe(s) (acquired), left foot: Secondary | ICD-10-CM | POA: Diagnosis not present

## 2016-02-23 DIAGNOSIS — T84498A Other mechanical complication of other internal orthopedic devices, implants and grafts, initial encounter: Secondary | ICD-10-CM | POA: Diagnosis not present

## 2016-02-23 DIAGNOSIS — M21612 Bunion of left foot: Secondary | ICD-10-CM | POA: Diagnosis not present

## 2016-02-23 MED ORDER — FENTANYL CITRATE (PF) 100 MCG/2ML IJ SOLN
INTRAMUSCULAR | Status: AC
  Filled 2016-02-23: qty 2

## 2016-02-23 MED ORDER — HYDROMORPHONE HCL 2 MG/ML IJ SOLN
1.0000 mg | Freq: Once | INTRAMUSCULAR | Status: AC
  Administered 2016-02-23: 1 mg via INTRAVENOUS
  Filled 2016-02-23: qty 1

## 2016-02-23 MED ORDER — FENTANYL CITRATE (PF) 100 MCG/2ML IJ SOLN
50.0000 ug | INTRAMUSCULAR | Status: AC | PRN
  Administered 2016-02-23 (×2): 50 ug via INTRAVENOUS

## 2016-02-23 MED ORDER — FENTANYL CITRATE (PF) 100 MCG/2ML IJ SOLN: 50.0000 ug | INTRAMUSCULAR | Status: DC | PRN

## 2016-02-23 NOTE — ED Triage Notes (Signed)
Pt had foot surgery this morning by Dr. [facility].  Anesthesia wore off about 6pm, pt took Oxycodone at 6, 6:30 and 8pm.  Pt crying in pain.  Tried calling office, no after hours service.

## 2016-02-23 NOTE — ED Notes (Signed)
Pt crying reports no change in pain.  Re-dosed with Fentanyl,comfort measures given.

## 2016-02-23 NOTE — ED Provider Notes (Signed)
[facility]-EMERGENCY DEPT Provider Note   CSN: [id number] Arrival date & time: 02/23/16  2121  By signing my name below, I, Clovis PuAvnee Patel, attest that this documentation has been prepared under the direction and in the presence of Shon Batonourtney F Horton, MD  Electronically Signed: Clovis PuAvnee Patel, ED Scribe. 02/23/16. 11:25 PM.   History   Chief Complaint Chief Complaint  Patient presents with  . Post-op Problem   The history is provided by the patient and the spouse. No language interpreter was used.   HPI Comments:  Valerie Perry is a 60 y.o. female who presents to the Emergency Department complaining of moderate L foot pain which began at 6 PM today. Pt had foot surgery today and notes her nerve block wore off. She rates her pain as a "7000/10". Husband states she had bones broken and pins placed in her foot during surgery. She has taken oxycodone with no relief. Pt denies similar pain in the past. Pt also denies radiation of the pain, numbness/tingling to her L foot and fevers. Pt denies any other complaints at this time.   Past Medical History:  Diagnosis Date  . No pertinent past medical history     Patient Active Problem List   Diagnosis Date Noted  . Bunion 07/14/2015  . Aspiration pneumonia (HCC) 05/27/2011  . Respiratory failure (HCC) 05/27/2011  . Nausea and vomiting 05/27/2011  . Severe sepsis(995.92) 05/27/2011  . Delirium 05/27/2011  . AKI (acute kidney injury) (HCC) 05/27/2011  . Elevated troponin 05/27/2011    Past Surgical History:  Procedure Laterality Date  . BREAST LUMPECTOMY Bilateral    3 surgeries  . FOOT SURGERY    . TUBAL LIGATION    . UTERINE FIBROID SURGERY      OB History    No data available       Home Medications    Prior to Admission medications   Medication Sig Start Date End Date Taking? Authorizing Provider  estradiol (VIVELLE-DOT) 0.0375 MG/24HR Place 1 patch onto the skin 2 (two) times a week.   Yes Historical Provider, MD    naphazoline-pheniramine (NAPHCON-A) 0.025-0.3 % ophthalmic solution Place 1 drop into both eyes 2 (two) times daily as needed for irritation.   Yes Historical Provider, MD  oxyCODONE (ROXICODONE) 15 MG immediate release tablet Take 15 mg by mouth every 4 (four) hours as needed. For pain   Yes Historical Provider, MD  progesterone (PROMETRIUM) 100 MG capsule Take 100 mg by mouth at bedtime.   Yes Historical Provider, MD    Family History History reviewed. No pertinent family history.  Social History Social History  Substance Use Topics  . Smoking status: Never Smoker  . Smokeless tobacco: Never Used  . Alcohol use No     Allergies   Codeine   Review of Systems Review of Systems  Constitutional: Negative for fever.  Musculoskeletal: Positive for arthralgias.  Neurological: Negative for numbness.  All other systems reviewed and are negative.    Physical Exam Updated Vital Signs BP 113/62   Pulse 69   Temp 99.1 F (37.3 C) (Oral)   Resp 12   SpO2 98%   Physical Exam  Constitutional: She is oriented to person, place, and time. She appears well-developed and well-nourished.  Tearful, uncomfortable appearing  HENT:  Head: Normocephalic and atraumatic.  Cardiovascular: Normal rate, regular rhythm and normal heart sounds.   Pulmonary/Chest: Effort normal and breath sounds normal. No respiratory distress. She has no wheezes.  Musculoskeletal:  Left foot wrapped  in [place] and in a shoe, toes visualize, sutures noted, appropriate capillary refill  Neurological: She is alert and oriented to person, place, and time.  Skin: Skin is warm and dry.  Psychiatric: She has a normal mood and affect.  Nursing note and vitals reviewed.    ED Treatments / Results  DIAGNOSTIC STUDIES:  Oxygen Saturation is 100% on RA, normal by my interpretation.    COORDINATION OF CARE:  11:18 PM Discussed treatment plan with pt at bedside and pt agreed to plan.  Labs (all labs ordered are  listed, but only abnormal results are displayed) Labs Reviewed - No data to display  EKG  EKG Interpretation None       Radiology No results found.  Procedures Procedures (including critical care time)  Medications Ordered in ED Medications  fentaNYL (SUBLIMAZE) injection 50 mcg (50 mcg Intravenous Given 02/23/16 2222)  HYDROmorphone (DILAUDID) injection 1 mg (1 mg Intravenous Given 02/23/16 2330)  oxyCODONE-acetaminophen (PERCOCET/ROXICET) 5-325 MG per tablet 2 tablet (2 tablets Oral Given 02/24/16 0027)  HYDROmorphone (DILAUDID) injection 1 mg (1 mg Intravenous Given 02/24/16 0111)     Initial Impression / Assessment and Plan / ED Course  I have reviewed the triage vital signs and the nursing notes.  Pertinent labs & imaging results that were available during my care of the patient were reviewed by me and considered in my medical decision making (see chart for details).  Clinical Course    Patient presents with postoperative pain. She had a regional block which wall earlier than expected. She has been unable to control her pain at home. She is within 24 hours of the immediate operative.. Low risk for infection. This is likely uncontrolled postop pain. Patient was given Dilaudid. Given her discomfort, I hesitated to remove the operative bandage. I discussed the patient with the on-call orthopedist who agrees that this is likely postop pain. No necessity to remove the bandage. She was given multiple doses of Dilaudid as well as by mouth pain medication.  2:36 AM Pain is now well controlled. Her last dose of by mouth pain medication was 12:30 AM. I have encouraged her to take a scheduled dose of pain medication at 4:30 AM. Depending on her pain she can take up to 3 5 mg oxycodone. Follow-up with orthopedist first thing tomorrow.  After history, exam, and medical workup I feel the patient has been appropriately medically screened and is safe for discharge home. Pertinent diagnoses  were discussed with the patient. Patient was given return precautions.   Final Clinical Impressions(s) / ED Diagnoses   Final diagnoses:  Post-op pain    New Prescriptions New Prescriptions   No medications on file   I personally performed the services described in this documentation, which was scribed in my presence. The recorded information has been reviewed and is accurate.     Shon Baton, MD 02/24/16 [phone]

## 2016-02-24 MED ORDER — HYDROMORPHONE HCL 2 MG/ML IJ SOLN
1.0000 mg | Freq: Once | INTRAMUSCULAR | Status: AC
  Administered 2016-02-24: 1 mg via INTRAVENOUS
  Filled 2016-02-24: qty 1

## 2016-02-24 MED ORDER — OXYCODONE-ACETAMINOPHEN 5-325 MG PO TABS
2.0000 | ORAL_TABLET | Freq: Once | ORAL | Status: AC
  Administered 2016-02-24: 2 via ORAL
  Filled 2016-02-24: qty 2

## 2016-02-24 NOTE — Discharge Instructions (Signed)
Make sure to take an additional dose of pain medication at 4:30. Depending on your pain he may take up to 3 5 mg oxycodone. Call your surgeon first thing in the morning.

## 2016-03-07 DIAGNOSIS — Z9889 Other specified postprocedural states: Secondary | ICD-10-CM | POA: Diagnosis not present

## 2016-03-07 DIAGNOSIS — Z8739 Personal history of other diseases of the musculoskeletal system and connective tissue: Secondary | ICD-10-CM | POA: Diagnosis not present

## 2016-04-06 DIAGNOSIS — Z9889 Other specified postprocedural states: Secondary | ICD-10-CM | POA: Diagnosis not present

## 2016-04-06 DIAGNOSIS — Z8739 Personal history of other diseases of the musculoskeletal system and connective tissue: Secondary | ICD-10-CM | POA: Diagnosis not present

## 2016-04-06 DIAGNOSIS — M1612 Unilateral primary osteoarthritis, left hip: Secondary | ICD-10-CM | POA: Diagnosis not present

## 2016-04-14 DIAGNOSIS — J069 Acute upper respiratory infection, unspecified: Secondary | ICD-10-CM | POA: Diagnosis not present

## 2016-04-14 DIAGNOSIS — R05 Cough: Secondary | ICD-10-CM | POA: Diagnosis not present

## 2016-05-09 DIAGNOSIS — E559 Vitamin D deficiency, unspecified: Secondary | ICD-10-CM | POA: Diagnosis not present

## 2016-05-09 DIAGNOSIS — E538 Deficiency of other specified B group vitamins: Secondary | ICD-10-CM | POA: Diagnosis not present

## 2016-05-09 DIAGNOSIS — Z0000 Encounter for general adult medical examination without abnormal findings: Secondary | ICD-10-CM | POA: Diagnosis not present

## 2016-05-09 DIAGNOSIS — M21612 Bunion of left foot: Secondary | ICD-10-CM | POA: Diagnosis not present

## 2016-05-09 DIAGNOSIS — R609 Edema, unspecified: Secondary | ICD-10-CM | POA: Diagnosis not present

## 2016-05-17 DIAGNOSIS — M2042 Other hammer toe(s) (acquired), left foot: Secondary | ICD-10-CM | POA: Diagnosis not present

## 2016-05-24 DIAGNOSIS — M2042 Other hammer toe(s) (acquired), left foot: Secondary | ICD-10-CM | POA: Diagnosis not present

## 2016-05-26 DIAGNOSIS — M2042 Other hammer toe(s) (acquired), left foot: Secondary | ICD-10-CM | POA: Diagnosis not present

## 2016-06-02 DIAGNOSIS — M2042 Other hammer toe(s) (acquired), left foot: Secondary | ICD-10-CM | POA: Diagnosis not present

## 2016-06-20 DIAGNOSIS — Z9889 Other specified postprocedural states: Secondary | ICD-10-CM | POA: Diagnosis not present

## 2016-06-20 DIAGNOSIS — M2042 Other hammer toe(s) (acquired), left foot: Secondary | ICD-10-CM | POA: Diagnosis not present

## 2016-06-30 DIAGNOSIS — Z1231 Encounter for screening mammogram for malignant neoplasm of breast: Secondary | ICD-10-CM | POA: Diagnosis not present

## 2016-12-26 DIAGNOSIS — M21622 Bunionette of left foot: Secondary | ICD-10-CM | POA: Diagnosis not present

## 2016-12-26 DIAGNOSIS — M7742 Metatarsalgia, left foot: Secondary | ICD-10-CM | POA: Diagnosis not present

## 2016-12-26 DIAGNOSIS — M21612 Bunion of left foot: Secondary | ICD-10-CM | POA: Diagnosis not present

## 2016-12-27 DIAGNOSIS — H1045 Other chronic allergic conjunctivitis: Secondary | ICD-10-CM | POA: Diagnosis not present

## 2017-02-21 DIAGNOSIS — Z6834 Body mass index (BMI) 34.0-34.9, adult: Secondary | ICD-10-CM | POA: Diagnosis not present

## 2017-02-21 DIAGNOSIS — Z01419 Encounter for gynecological examination (general) (routine) without abnormal findings: Secondary | ICD-10-CM | POA: Diagnosis not present

## 2017-03-09 DIAGNOSIS — R1032 Left lower quadrant pain: Secondary | ICD-10-CM | POA: Diagnosis not present

## 2017-03-09 DIAGNOSIS — Z13820 Encounter for screening for osteoporosis: Secondary | ICD-10-CM | POA: Diagnosis not present

## 2017-03-27 DIAGNOSIS — D259 Leiomyoma of uterus, unspecified: Secondary | ICD-10-CM | POA: Diagnosis not present

## 2017-03-27 DIAGNOSIS — R9389 Abnormal findings on diagnostic imaging of other specified body structures: Secondary | ICD-10-CM | POA: Diagnosis not present

## 2017-04-04 DIAGNOSIS — M8580 Other specified disorders of bone density and structure, unspecified site: Secondary | ICD-10-CM | POA: Diagnosis not present

## 2017-05-09 ENCOUNTER — Ambulatory Visit
Admission: RE | Admit: 2017-05-09 | Discharge: 2017-05-09 | Disposition: A | Discharge status: Home / Self Care | Payer: BLUE CROSS/BLUE SHIELD | Source: Ambulatory Visit | Attending: Internal Medicine | Admitting: Internal Medicine

## 2017-05-09 ENCOUNTER — Other Ambulatory Visit: Payer: Self-pay | Admitting: Internal Medicine

## 2017-05-09 DIAGNOSIS — R059 Cough, unspecified: Secondary | ICD-10-CM

## 2017-05-09 DIAGNOSIS — E669 Obesity, unspecified: Secondary | ICD-10-CM | POA: Diagnosis not present

## 2017-05-09 DIAGNOSIS — R600 Localized edema: Secondary | ICD-10-CM | POA: Diagnosis not present

## 2017-05-09 DIAGNOSIS — R05 Cough: Secondary | ICD-10-CM | POA: Diagnosis not present

## 2017-05-09 DIAGNOSIS — R739 Hyperglycemia, unspecified: Secondary | ICD-10-CM | POA: Diagnosis not present

## 2017-05-09 DIAGNOSIS — Z0000 Encounter for general adult medical examination without abnormal findings: Secondary | ICD-10-CM | POA: Diagnosis not present

## 2017-05-09 DIAGNOSIS — Z6834 Body mass index (BMI) 34.0-34.9, adult: Secondary | ICD-10-CM | POA: Diagnosis not present

## 2017-05-29 DIAGNOSIS — R05 Cough: Secondary | ICD-10-CM | POA: Diagnosis not present

## 2017-06-01 DIAGNOSIS — N840 Polyp of corpus uteri: Secondary | ICD-10-CM | POA: Diagnosis not present

## 2017-07-06 DIAGNOSIS — H02051 Trichiasis without entropian right upper eyelid: Secondary | ICD-10-CM | POA: Diagnosis not present

## 2017-10-16 DIAGNOSIS — Z1231 Encounter for screening mammogram for malignant neoplasm of breast: Secondary | ICD-10-CM | POA: Diagnosis not present

## 2017-11-22 DIAGNOSIS — R600 Localized edema: Secondary | ICD-10-CM | POA: Diagnosis not present

## 2017-11-22 DIAGNOSIS — I872 Venous insufficiency (chronic) (peripheral): Secondary | ICD-10-CM | POA: Diagnosis not present

## 2017-11-24 ENCOUNTER — Other Ambulatory Visit (HOSPITAL_COMMUNITY): Payer: Self-pay | Admitting: Internal Medicine

## 2017-11-24 DIAGNOSIS — R609 Edema, unspecified: Secondary | ICD-10-CM

## 2017-11-29 ENCOUNTER — Other Ambulatory Visit (HOSPITAL_COMMUNITY): Payer: BLUE CROSS/BLUE SHIELD

## 2017-12-06 ENCOUNTER — Other Ambulatory Visit (HOSPITAL_COMMUNITY): Payer: BLUE CROSS/BLUE SHIELD

## 2017-12-07 DIAGNOSIS — H43811 Vitreous degeneration, right eye: Secondary | ICD-10-CM | POA: Diagnosis not present

## 2017-12-08 ENCOUNTER — Ambulatory Visit (HOSPITAL_COMMUNITY): Payer: BLUE CROSS/BLUE SHIELD | Attending: Internal Medicine

## 2017-12-08 ENCOUNTER — Other Ambulatory Visit: Payer: Self-pay

## 2017-12-08 ENCOUNTER — Encounter (INDEPENDENT_AMBULATORY_CARE_PROVIDER_SITE_OTHER): Payer: Self-pay

## 2017-12-08 DIAGNOSIS — R609 Edema, unspecified: Secondary | ICD-10-CM | POA: Diagnosis not present

## 2017-12-14 DIAGNOSIS — R609 Edema, unspecified: Secondary | ICD-10-CM | POA: Diagnosis not present

## 2018-01-30 DIAGNOSIS — Z23 Encounter for immunization: Secondary | ICD-10-CM | POA: Diagnosis not present

## 2018-01-30 DIAGNOSIS — I872 Venous insufficiency (chronic) (peripheral): Secondary | ICD-10-CM | POA: Diagnosis not present

## 2018-04-05 DIAGNOSIS — I872 Venous insufficiency (chronic) (peripheral): Secondary | ICD-10-CM | POA: Diagnosis not present

## 2018-06-28 DIAGNOSIS — H01011 Ulcerative blepharitis right upper eyelid: Secondary | ICD-10-CM | POA: Diagnosis not present

## 2018-10-25 DIAGNOSIS — Z01419 Encounter for gynecological examination (general) (routine) without abnormal findings: Secondary | ICD-10-CM | POA: Diagnosis not present

## 2018-10-25 DIAGNOSIS — Z1231 Encounter for screening mammogram for malignant neoplasm of breast: Secondary | ICD-10-CM | POA: Diagnosis not present

## 2018-10-25 DIAGNOSIS — Z6836 Body mass index (BMI) 36.0-36.9, adult: Secondary | ICD-10-CM | POA: Diagnosis not present

## 2018-10-25 DIAGNOSIS — N951 Menopausal and female climacteric states: Secondary | ICD-10-CM | POA: Diagnosis not present

## 2018-11-21 DIAGNOSIS — N3090 Cystitis, unspecified without hematuria: Secondary | ICD-10-CM | POA: Diagnosis not present

## 2018-11-21 DIAGNOSIS — R399 Unspecified symptoms and signs involving the genitourinary system: Secondary | ICD-10-CM | POA: Diagnosis not present

## 2018-11-21 DIAGNOSIS — N390 Urinary tract infection, site not specified: Secondary | ICD-10-CM | POA: Diagnosis not present

## 2019-03-06 ENCOUNTER — Ambulatory Visit
Admission: RE | Admit: 2019-03-06 | Discharge: 2019-03-06 | Disposition: A | Discharge status: Home / Self Care | Payer: BLUE CROSS/BLUE SHIELD | Source: Ambulatory Visit | Attending: Internal Medicine | Admitting: Internal Medicine

## 2019-03-06 ENCOUNTER — Other Ambulatory Visit: Payer: Self-pay | Admitting: Internal Medicine

## 2019-03-06 DIAGNOSIS — R05 Cough: Secondary | ICD-10-CM | POA: Diagnosis not present

## 2019-03-06 DIAGNOSIS — M25551 Pain in right hip: Secondary | ICD-10-CM

## 2019-03-06 DIAGNOSIS — M5489 Other dorsalgia: Secondary | ICD-10-CM

## 2019-03-06 DIAGNOSIS — Z5181 Encounter for therapeutic drug level monitoring: Secondary | ICD-10-CM | POA: Diagnosis not present

## 2019-03-06 DIAGNOSIS — M1611 Unilateral primary osteoarthritis, right hip: Secondary | ICD-10-CM | POA: Diagnosis not present

## 2019-03-06 DIAGNOSIS — Z23 Encounter for immunization: Secondary | ICD-10-CM | POA: Diagnosis not present

## 2019-03-06 DIAGNOSIS — M545 Low back pain: Secondary | ICD-10-CM | POA: Diagnosis not present

## 2019-03-11 DIAGNOSIS — M7061 Trochanteric bursitis, right hip: Secondary | ICD-10-CM | POA: Diagnosis not present

## 2019-03-11 DIAGNOSIS — Z6835 Body mass index (BMI) 35.0-35.9, adult: Secondary | ICD-10-CM | POA: Diagnosis not present

## 2019-03-11 DIAGNOSIS — M7062 Trochanteric bursitis, left hip: Secondary | ICD-10-CM | POA: Diagnosis not present

## 2019-03-19 DIAGNOSIS — M7062 Trochanteric bursitis, left hip: Secondary | ICD-10-CM | POA: Diagnosis not present

## 2019-03-19 DIAGNOSIS — M7061 Trochanteric bursitis, right hip: Secondary | ICD-10-CM | POA: Diagnosis not present

## 2019-04-04 DIAGNOSIS — K219 Gastro-esophageal reflux disease without esophagitis: Secondary | ICD-10-CM | POA: Diagnosis not present

## 2019-04-04 DIAGNOSIS — M7061 Trochanteric bursitis, right hip: Secondary | ICD-10-CM | POA: Diagnosis not present

## 2019-04-04 DIAGNOSIS — R05 Cough: Secondary | ICD-10-CM | POA: Diagnosis not present

## 2019-04-09 DIAGNOSIS — K580 Irritable bowel syndrome with diarrhea: Secondary | ICD-10-CM | POA: Diagnosis not present

## 2019-04-09 DIAGNOSIS — Z1211 Encounter for screening for malignant neoplasm of colon: Secondary | ICD-10-CM | POA: Diagnosis not present

## 2019-04-09 DIAGNOSIS — R635 Abnormal weight gain: Secondary | ICD-10-CM | POA: Diagnosis not present

## 2019-04-09 DIAGNOSIS — K219 Gastro-esophageal reflux disease without esophagitis: Secondary | ICD-10-CM | POA: Diagnosis not present

## 2019-04-11 DIAGNOSIS — K580 Irritable bowel syndrome with diarrhea: Secondary | ICD-10-CM | POA: Diagnosis not present

## 2019-04-11 DIAGNOSIS — Z1211 Encounter for screening for malignant neoplasm of colon: Secondary | ICD-10-CM | POA: Diagnosis not present

## 2019-04-11 DIAGNOSIS — K219 Gastro-esophageal reflux disease without esophagitis: Secondary | ICD-10-CM | POA: Diagnosis not present

## 2019-04-11 DIAGNOSIS — R635 Abnormal weight gain: Secondary | ICD-10-CM | POA: Diagnosis not present

## 2019-05-01 DIAGNOSIS — D122 Benign neoplasm of ascending colon: Secondary | ICD-10-CM | POA: Diagnosis not present

## 2019-05-01 DIAGNOSIS — D124 Benign neoplasm of descending colon: Secondary | ICD-10-CM | POA: Diagnosis not present

## 2019-05-01 DIAGNOSIS — Z1211 Encounter for screening for malignant neoplasm of colon: Secondary | ICD-10-CM | POA: Diagnosis not present

## 2019-05-01 DIAGNOSIS — K635 Polyp of colon: Secondary | ICD-10-CM | POA: Diagnosis not present

## 2019-06-24 DIAGNOSIS — M545 Low back pain: Secondary | ICD-10-CM | POA: Diagnosis not present

## 2019-06-24 DIAGNOSIS — M419 Scoliosis, unspecified: Secondary | ICD-10-CM | POA: Diagnosis not present

## 2019-06-24 DIAGNOSIS — M47896 Other spondylosis, lumbar region: Secondary | ICD-10-CM | POA: Diagnosis not present

## 2019-06-24 DIAGNOSIS — Z6836 Body mass index (BMI) 36.0-36.9, adult: Secondary | ICD-10-CM | POA: Diagnosis not present

## 2019-06-28 DIAGNOSIS — Z7409 Other reduced mobility: Secondary | ICD-10-CM | POA: Diagnosis not present

## 2019-06-28 DIAGNOSIS — Z789 Other specified health status: Secondary | ICD-10-CM | POA: Diagnosis not present

## 2019-07-15 DIAGNOSIS — Z7409 Other reduced mobility: Secondary | ICD-10-CM | POA: Diagnosis not present

## 2019-07-15 DIAGNOSIS — R601 Generalized edema: Secondary | ICD-10-CM | POA: Diagnosis not present

## 2019-07-15 DIAGNOSIS — M419 Scoliosis, unspecified: Secondary | ICD-10-CM | POA: Diagnosis not present

## 2019-07-24 DIAGNOSIS — Z7409 Other reduced mobility: Secondary | ICD-10-CM | POA: Diagnosis not present

## 2019-07-24 DIAGNOSIS — R601 Generalized edema: Secondary | ICD-10-CM | POA: Diagnosis not present

## 2019-08-05 DIAGNOSIS — M419 Scoliosis, unspecified: Secondary | ICD-10-CM | POA: Diagnosis not present

## 2019-08-05 DIAGNOSIS — M47896 Other spondylosis, lumbar region: Secondary | ICD-10-CM | POA: Diagnosis not present

## 2019-08-05 DIAGNOSIS — Z6836 Body mass index (BMI) 36.0-36.9, adult: Secondary | ICD-10-CM | POA: Diagnosis not present

## 2019-08-05 DIAGNOSIS — I10 Essential (primary) hypertension: Secondary | ICD-10-CM | POA: Diagnosis not present

## 2019-08-20 DIAGNOSIS — I872 Venous insufficiency (chronic) (peripheral): Secondary | ICD-10-CM | POA: Diagnosis not present

## 2019-08-21 DIAGNOSIS — M48061 Spinal stenosis, lumbar region without neurogenic claudication: Secondary | ICD-10-CM | POA: Diagnosis not present

## 2019-08-21 DIAGNOSIS — M419 Scoliosis, unspecified: Secondary | ICD-10-CM | POA: Diagnosis not present

## 2019-08-30 DIAGNOSIS — M5416 Radiculopathy, lumbar region: Secondary | ICD-10-CM | POA: Diagnosis not present

## 2019-08-30 DIAGNOSIS — M419 Scoliosis, unspecified: Secondary | ICD-10-CM | POA: Diagnosis not present

## 2019-08-30 DIAGNOSIS — M47816 Spondylosis without myelopathy or radiculopathy, lumbar region: Secondary | ICD-10-CM | POA: Diagnosis not present

## 2019-08-30 DIAGNOSIS — M48062 Spinal stenosis, lumbar region with neurogenic claudication: Secondary | ICD-10-CM | POA: Diagnosis not present

## 2019-09-09 ENCOUNTER — Encounter: Payer: BC Managed Care – PPO | Admitting: Surgery

## 2019-09-09 ENCOUNTER — Encounter (HOSPITAL_COMMUNITY): Payer: BC Managed Care – PPO

## 2019-09-10 ENCOUNTER — Other Ambulatory Visit: Payer: Self-pay | Admitting: *Deleted

## 2019-09-10 DIAGNOSIS — M48062 Spinal stenosis, lumbar region with neurogenic claudication: Secondary | ICD-10-CM | POA: Diagnosis not present

## 2019-09-10 DIAGNOSIS — I872 Venous insufficiency (chronic) (peripheral): Secondary | ICD-10-CM

## 2019-09-11 NOTE — Progress Notes (Signed)
[facility]           OF [place]  History and Physical   Valerie Perry is a 64 y.o. ([birth year]) female who presents with hx of hx of leg swelling that has been ongoing for years.  She saw [facility] back in 2015 and at that time, she was measured for compression socks.  She states that she wears them when she travels but otherwise, when she wears them, it pushes the swelling upward.  She does not have any hx of DVT.  She does not have any family hx of venous problems or leg swelling.  She has hx of bilateral foot surgeries.  The only abdominal or groin procedures she's had are bilateral tubal ligation and removal of fibroid with robot.  She does have skin changes and bumps that are present.  She states that she has been given creams and gels that have not helped.     The pt is not on a statin for cholesterol management.  The pt is not on a daily aspirin.   Other AC:  none The pt is not on meds for hypertension.   The pt is not diabetic.   Tobacco hx:  never  Past Medical History:  Diagnosis Date  . No pertinent past medical history     Past Surgical History:  Procedure Laterality Date  . BREAST LUMPECTOMY Bilateral    3 surgeries  . FOOT SURGERY    . TUBAL LIGATION    . UTERINE FIBROID SURGERY      Social History   Socioeconomic History  . Marital status: Married    Spouse name: Not on file  . Number of children: Not on file  . Years of education: Not on file  . Highest education level: Not on file  Occupational History  . Not on file  Tobacco Use  . Smoking status: Never Smoker  . Smokeless tobacco: Never Used  Substance and Sexual Activity  . Alcohol use: No  . Drug use: No  . Sexual activity: Not on file  Other Topics Concern  . Not on file  Social History Narrative  . Not on file   Social Determinants of Health   Financial Resource Strain:   . Difficulty of Paying Living Expenses:   Food Insecurity:   . Worried About  Programme researcher, broadcasting/film/video in the Last Year:   . Barista in the Last Year:   Transportation Needs:   . Freight forwarder (Medical):   Marland Kitchen Lack of Transportation (Non-Medical):   Physical Activity:   . Days of Exercise per Week:   . Minutes of Exercise per Session:   Stress:   . Feeling of Stress :   Social Connections:   . Frequency of Communication with Friends and Family:   . Frequency of Social Gatherings with Friends and Family:   . Attends Religious Services:   . Active Member of Clubs or Organizations:   . Attends Banker Meetings:   Marland Kitchen Marital Status:   Intimate Partner Violence:   . Fear of Current or Ex-Partner:   . Emotionally Abused:   Marland Kitchen Physically Abused:   . Sexually Abused:     No family hx of AAA  Current Outpatient Medications  Medication Sig Dispense Refill  . estradiol (VIVELLE-DOT) 0.0375 MG/24HR Place 1 patch onto the skin 2 (two) times a week.    . naphazoline-pheniramine (NAPHCON-A) 0.025-0.3 %  ophthalmic solution Place 1 drop into both eyes 2 (two) times daily as needed for irritation.    Marland Kitchen oxyCODONE (ROXICODONE) 15 MG immediate release tablet Take 15 mg by mouth every 4 (four) hours as needed. For pain    . progesterone (PROMETRIUM) 100 MG capsule Take 100 mg by mouth at bedtime.     No current facility-administered medications for this visit.    Allergies  Allergen Reactions  . Codeine     Upset stomach    REVIEW OF SYSTEMS:   [X]  denotes positive finding, [ ]  denotes negative finding Cardiac  Comments:  Chest pain or chest pressure:    Shortness of breath upon exertion:    Short of breath when lying flat:    Irregular heart rhythm:        Vascular    Pain in calf, thigh, or hip brought on by ambulation:    Pain in feet at night that wakes you up from your sleep:     Blood clot in your veins:    Leg swelling:  x       Pulmonary    Oxygen at home:    Productive cough:     Wheezing:  x       Neurologic    Sudden  weakness in arms or legs:     Sudden numbness in arms or legs:     Sudden onset of difficulty speaking or slurred speech:    Temporary loss of vision in one eye:     Problems with dizziness:         Gastrointestinal    Blood in stool:     Vomited blood:         Genitourinary    Burning when urinating:     Blood in urine:        Psychiatric    Major depression:         Hematologic    Bleeding problems:    Problems with blood clotting too easily:        Skin    Rashes or ulcers:        Constitutional    Fever or chills:      PHYSICAL EXAMINATION:  Today's Vitals   09/13/19 1438  BP: (!) 158/81  Pulse: 63  Resp: 16  Temp: 97.7 F (36.5 C)  TempSrc: Temporal  SpO2: 99%  Weight: 200 lb (90.7 kg)  Height: 5' 4.5" (1.638 m)   Body mass index is 33.8 kg/m.   General:  WDWN in NAD; vital signs documented above Gait: Not observed HENT: WNL, normocephalic Pulmonary: normal non-labored breathing , without Rales, rhonchi,  wheezing Cardiac: regular HR; without carotid bruits Abdomen: soft, NT, no masses Skin: without rashes Vascular Exam/Pulses:  Right Left  Radial 2+ (normal) 2+ (normal)  Ulnar Unable to palpate  Unable to palpate   Popliteal Unable to palpate  Unable to palpate   DP 2+ (normal) 2+ (normal)  PT 2+ (normal) 2+ (normal)   Extremities: without ischemic changes, without Gangrene , without cellulitis; without open wounds;  She does have bilateral leg swelling with skin changes.   Musculoskeletal: no muscle wasting or atrophy  Neurologic: A&O X 3;  No focal weakness or paresthesias are detected Psychiatric:  The pt has Normal affect.   Non-Invasive Vascular Imaging:   Venous duplex on 09/13/2019: Summary:  Venous Reflux Times  +--------------+---------+------+-----------+------------+--------+  RIGHT     Reflux NoRefluxReflux TimeDiameter cmsComments  Yes                     +--------------+---------+------+-----------+------------+--------+  GSV at [facility]                  .79         +--------------+---------+------+-----------+------------+--------+  GSV prox thigh                .70         +--------------+---------+------+-----------+------------+--------+  GSV mid thigh                  .3         +--------------+---------+------+-----------+------------+--------+  GSV dist thigh                 .3         +--------------+---------+------+-----------+------------+--------+  GSV at knee                  .25         +--------------+---------+------+-----------+------------+--------+  GSV prox calf                 .23         +--------------+---------+------+-----------+------------+--------+  GSV mid calf                 .196        +--------------+---------+------+-----------+------------+--------+  GSV dist calf       yes  >500 ms   .198        +--------------+---------+------+-----------+------------+--------+  SSV Pop Fossa                 .458        +--------------+---------+------+-----------+------------+--------+  SSV prox calf                 .324        +--------------+---------+------+-----------+------------+--------+  SSV mid calf                 .345        +--------------+---------+------+-----------+------------+--------+     +--------------+---------+------+-----------+------------+--------+  LEFT     Reflux NoRefluxReflux TimeDiameter cmsComments               Yes                   +--------------+---------+------+-----------+------------+--------+  GSV at SFJ                   .995        +--------------+---------+------+-----------+------------+--------+  GSV prox thigh                .524        +--------------+---------+------+-----------+------------+--------+  GSV mid thigh                 .283        +--------------+---------+------+-----------+------------+--------+  GSV dist thigh                .325        +--------------+---------+------+-----------+------------+--------+  GSV at knee                  .231        +--------------+---------+------+-----------+------------+--------+  GSV prox calf                 .17         +--------------+---------+------+-----------+------------+--------+  GSV mid calf                 .19         +--------------+---------+------+-----------+------------+--------+  GSV dist calf                 .15         +--------------+---------+------+-----------+------------+--------+  SSV Pop Fossa                 .335        +--------------+---------+------+-----------+------------+--------+  SSV mid calf                 .243         Bilateral:  - No evidence of deep vein thrombosis seen in the lower extremities,  bilaterally, from the common femoral through the popliteal veins.  - No evidence of superficial venous thrombosis in the lower extremities,  bilaterally.  - No evidence of deep venous insufficiency seen bilaterally in the lower  extremity.    Right:  - No evidence of superficial venous reflux seen in the right short  saphenous vein.  - Venous reflux is noted in the right greater saphenous vein in the calf.    Left:  - No evidence of superficial venous reflux seen in the left greater  saphenous vein.  - No evidence of superficial venous reflux seen in the left  short  saphenous vein.   SASHAY Perry is a 64 y.o. female who presents with: hx of bilateral leg swelling with skin changes.   -there is no evidence of DVT in BLE or deep venous insufficiency.  She does have some reflux noted in the right GSV in the calf and no reflux on the left.   -From vascular standpoint, recommend continuing compression socks and elevation as she is not a candidate for laser ablation.    Doreatha Massed, [facility] 09/11/2019 4:37 PM  Clinic MD:  Randie Heinz

## 2019-09-13 ENCOUNTER — Other Ambulatory Visit: Payer: Self-pay

## 2019-09-13 ENCOUNTER — Ambulatory Visit (HOSPITAL_COMMUNITY)
Admission: RE | Admit: 2019-09-13 | Discharge: 2019-09-13 | Disposition: A | Discharge status: Home / Self Care | Payer: BC Managed Care – PPO | Source: Ambulatory Visit | Attending: Vascular Surgery | Admitting: Vascular Surgery

## 2019-09-13 ENCOUNTER — Ambulatory Visit (INDEPENDENT_AMBULATORY_CARE_PROVIDER_SITE_OTHER): Payer: BC Managed Care – PPO | Admitting: Physician Assistant

## 2019-09-13 VITALS — BP 158/81 | HR 63 | Temp 97.7°F | Resp 16 | Ht 64.5 in | Wt 200.0 lb

## 2019-09-13 DIAGNOSIS — I872 Venous insufficiency (chronic) (peripheral): Secondary | ICD-10-CM

## 2019-09-13 DIAGNOSIS — M7989 Other specified soft tissue disorders: Secondary | ICD-10-CM

## 2019-09-13 DIAGNOSIS — M549 Dorsalgia, unspecified: Secondary | ICD-10-CM | POA: Insufficient documentation

## 2019-09-27 DIAGNOSIS — M5416 Radiculopathy, lumbar region: Secondary | ICD-10-CM | POA: Diagnosis not present

## 2019-09-27 DIAGNOSIS — M545 Low back pain: Secondary | ICD-10-CM | POA: Diagnosis not present

## 2019-09-27 DIAGNOSIS — M419 Scoliosis, unspecified: Secondary | ICD-10-CM | POA: Diagnosis not present

## 2019-09-27 DIAGNOSIS — M48062 Spinal stenosis, lumbar region with neurogenic claudication: Secondary | ICD-10-CM | POA: Diagnosis not present

## 2019-10-08 DIAGNOSIS — M47816 Spondylosis without myelopathy or radiculopathy, lumbar region: Secondary | ICD-10-CM | POA: Diagnosis not present

## 2019-10-29 DIAGNOSIS — M47816 Spondylosis without myelopathy or radiculopathy, lumbar region: Secondary | ICD-10-CM | POA: Diagnosis not present

## 2019-12-09 DIAGNOSIS — M47816 Spondylosis without myelopathy or radiculopathy, lumbar region: Secondary | ICD-10-CM | POA: Diagnosis not present

## 2019-12-09 DIAGNOSIS — M48062 Spinal stenosis, lumbar region with neurogenic claudication: Secondary | ICD-10-CM | POA: Diagnosis not present

## 2019-12-09 DIAGNOSIS — M419 Scoliosis, unspecified: Secondary | ICD-10-CM | POA: Diagnosis not present

## 2019-12-23 DIAGNOSIS — M47816 Spondylosis without myelopathy or radiculopathy, lumbar region: Secondary | ICD-10-CM | POA: Diagnosis not present

## 2020-01-21 DIAGNOSIS — M47816 Spondylosis without myelopathy or radiculopathy, lumbar region: Secondary | ICD-10-CM | POA: Diagnosis not present

## 2020-02-10 DIAGNOSIS — I10 Essential (primary) hypertension: Secondary | ICD-10-CM | POA: Diagnosis not present

## 2020-02-10 DIAGNOSIS — M47816 Spondylosis without myelopathy or radiculopathy, lumbar region: Secondary | ICD-10-CM | POA: Diagnosis not present

## 2020-02-10 DIAGNOSIS — Z6836 Body mass index (BMI) 36.0-36.9, adult: Secondary | ICD-10-CM | POA: Diagnosis not present

## 2020-02-27 DIAGNOSIS — M2011 Hallux valgus (acquired), right foot: Secondary | ICD-10-CM | POA: Diagnosis not present

## 2020-02-27 DIAGNOSIS — I739 Peripheral vascular disease, unspecified: Secondary | ICD-10-CM | POA: Diagnosis not present

## 2020-02-27 DIAGNOSIS — M2041 Other hammer toe(s) (acquired), right foot: Secondary | ICD-10-CM | POA: Diagnosis not present

## 2020-02-27 DIAGNOSIS — I70213 Atherosclerosis of native arteries of extremities with intermittent claudication, bilateral legs: Secondary | ICD-10-CM | POA: Diagnosis not present

## 2020-02-27 DIAGNOSIS — I70202 Unspecified atherosclerosis of native arteries of extremities, left leg: Secondary | ICD-10-CM | POA: Diagnosis not present

## 2020-02-27 DIAGNOSIS — M2012 Hallux valgus (acquired), left foot: Secondary | ICD-10-CM | POA: Diagnosis not present

## 2020-02-27 DIAGNOSIS — I70293 Other atherosclerosis of native arteries of extremities, bilateral legs: Secondary | ICD-10-CM | POA: Diagnosis not present

## 2020-02-27 DIAGNOSIS — M20012 Mallet finger of left finger(s): Secondary | ICD-10-CM | POA: Diagnosis not present

## 2020-03-05 DIAGNOSIS — I872 Venous insufficiency (chronic) (peripheral): Secondary | ICD-10-CM | POA: Diagnosis not present

## 2020-03-05 DIAGNOSIS — I878 Other specified disorders of veins: Secondary | ICD-10-CM | POA: Diagnosis not present

## 2020-03-19 DIAGNOSIS — M2012 Hallux valgus (acquired), left foot: Secondary | ICD-10-CM | POA: Diagnosis not present

## 2020-03-19 DIAGNOSIS — M792 Neuralgia and neuritis, unspecified: Secondary | ICD-10-CM | POA: Diagnosis not present

## 2020-03-19 DIAGNOSIS — M2011 Hallux valgus (acquired), right foot: Secondary | ICD-10-CM | POA: Diagnosis not present

## 2020-03-19 DIAGNOSIS — M2041 Other hammer toe(s) (acquired), right foot: Secondary | ICD-10-CM | POA: Diagnosis not present

## 2020-03-24 DIAGNOSIS — M2012 Hallux valgus (acquired), left foot: Secondary | ICD-10-CM | POA: Diagnosis not present

## 2020-03-24 DIAGNOSIS — M2011 Hallux valgus (acquired), right foot: Secondary | ICD-10-CM | POA: Diagnosis not present

## 2020-04-05 DIAGNOSIS — I83813 Varicose veins of bilateral lower extremities with pain: Secondary | ICD-10-CM | POA: Diagnosis not present

## 2020-04-05 DIAGNOSIS — I872 Venous insufficiency (chronic) (peripheral): Secondary | ICD-10-CM | POA: Diagnosis not present

## 2020-04-06 DIAGNOSIS — I872 Venous insufficiency (chronic) (peripheral): Secondary | ICD-10-CM | POA: Diagnosis not present

## 2020-04-06 DIAGNOSIS — I83813 Varicose veins of bilateral lower extremities with pain: Secondary | ICD-10-CM | POA: Diagnosis not present

## 2020-04-20 DIAGNOSIS — M2012 Hallux valgus (acquired), left foot: Secondary | ICD-10-CM | POA: Diagnosis not present

## 2020-04-20 DIAGNOSIS — M2041 Other hammer toe(s) (acquired), right foot: Secondary | ICD-10-CM | POA: Diagnosis not present

## 2020-04-20 DIAGNOSIS — M792 Neuralgia and neuritis, unspecified: Secondary | ICD-10-CM | POA: Diagnosis not present

## 2020-04-20 DIAGNOSIS — M2011 Hallux valgus (acquired), right foot: Secondary | ICD-10-CM | POA: Diagnosis not present

## 2020-05-31 DIAGNOSIS — I872 Venous insufficiency (chronic) (peripheral): Secondary | ICD-10-CM | POA: Diagnosis not present

## 2020-06-01 DIAGNOSIS — I872 Venous insufficiency (chronic) (peripheral): Secondary | ICD-10-CM | POA: Diagnosis not present

## 2020-06-11 DIAGNOSIS — I872 Venous insufficiency (chronic) (peripheral): Secondary | ICD-10-CM | POA: Diagnosis not present

## 2020-07-10 DIAGNOSIS — M25551 Pain in right hip: Secondary | ICD-10-CM | POA: Diagnosis not present

## 2020-07-29 DIAGNOSIS — M25551 Pain in right hip: Secondary | ICD-10-CM | POA: Diagnosis not present

## 2020-08-19 DIAGNOSIS — M25551 Pain in right hip: Secondary | ICD-10-CM | POA: Diagnosis not present

## 2020-09-02 ENCOUNTER — Other Ambulatory Visit: Payer: Self-pay | Admitting: Internal Medicine

## 2020-09-02 ENCOUNTER — Ambulatory Visit
Admission: RE | Admit: 2020-09-02 | Discharge: 2020-09-02 | Disposition: A | Discharge status: Home / Self Care | Payer: BC Managed Care – PPO | Source: Ambulatory Visit | Attending: Internal Medicine | Admitting: Internal Medicine

## 2020-09-02 DIAGNOSIS — R030 Elevated blood-pressure reading, without diagnosis of hypertension: Secondary | ICD-10-CM | POA: Diagnosis not present

## 2020-09-02 DIAGNOSIS — R609 Edema, unspecified: Secondary | ICD-10-CM | POA: Diagnosis not present

## 2020-09-02 DIAGNOSIS — R053 Chronic cough: Secondary | ICD-10-CM

## 2020-09-02 DIAGNOSIS — E039 Hypothyroidism, unspecified: Secondary | ICD-10-CM | POA: Diagnosis not present

## 2020-09-02 DIAGNOSIS — R0600 Dyspnea, unspecified: Secondary | ICD-10-CM | POA: Diagnosis not present

## 2020-09-02 DIAGNOSIS — R739 Hyperglycemia, unspecified: Secondary | ICD-10-CM | POA: Diagnosis not present

## 2020-09-02 DIAGNOSIS — D649 Anemia, unspecified: Secondary | ICD-10-CM | POA: Diagnosis not present

## 2020-09-02 DIAGNOSIS — K219 Gastro-esophageal reflux disease without esophagitis: Secondary | ICD-10-CM | POA: Diagnosis not present

## 2020-09-02 DIAGNOSIS — R059 Cough, unspecified: Secondary | ICD-10-CM | POA: Diagnosis not present

## 2020-09-09 DIAGNOSIS — M25551 Pain in right hip: Secondary | ICD-10-CM | POA: Diagnosis not present

## 2020-09-30 DIAGNOSIS — R059 Cough, unspecified: Secondary | ICD-10-CM | POA: Diagnosis not present

## 2020-09-30 DIAGNOSIS — R0600 Dyspnea, unspecified: Secondary | ICD-10-CM | POA: Diagnosis not present

## 2020-09-30 DIAGNOSIS — R609 Edema, unspecified: Secondary | ICD-10-CM | POA: Diagnosis not present

## 2020-09-30 DIAGNOSIS — K219 Gastro-esophageal reflux disease without esophagitis: Secondary | ICD-10-CM | POA: Diagnosis not present

## 2020-09-30 DIAGNOSIS — E039 Hypothyroidism, unspecified: Secondary | ICD-10-CM | POA: Diagnosis not present

## 2020-10-23 DIAGNOSIS — M1611 Unilateral primary osteoarthritis, right hip: Secondary | ICD-10-CM | POA: Diagnosis not present

## 2020-11-03 DIAGNOSIS — I872 Venous insufficiency (chronic) (peripheral): Secondary | ICD-10-CM | POA: Diagnosis not present

## 2020-11-03 DIAGNOSIS — R053 Chronic cough: Secondary | ICD-10-CM | POA: Diagnosis not present

## 2020-11-03 DIAGNOSIS — R609 Edema, unspecified: Secondary | ICD-10-CM | POA: Diagnosis not present

## 2020-11-03 DIAGNOSIS — E039 Hypothyroidism, unspecified: Secondary | ICD-10-CM | POA: Diagnosis not present

## 2020-11-04 DIAGNOSIS — R197 Diarrhea, unspecified: Secondary | ICD-10-CM | POA: Diagnosis not present

## 2020-11-06 DIAGNOSIS — M47816 Spondylosis without myelopathy or radiculopathy, lumbar region: Secondary | ICD-10-CM | POA: Diagnosis not present

## 2020-11-06 DIAGNOSIS — M5416 Radiculopathy, lumbar region: Secondary | ICD-10-CM | POA: Diagnosis not present

## 2020-11-17 DIAGNOSIS — M5416 Radiculopathy, lumbar region: Secondary | ICD-10-CM | POA: Diagnosis not present

## 2020-11-24 ENCOUNTER — Other Ambulatory Visit (HOSPITAL_COMMUNITY): Payer: Self-pay | Admitting: Internal Medicine

## 2020-11-24 DIAGNOSIS — E039 Hypothyroidism, unspecified: Secondary | ICD-10-CM | POA: Diagnosis not present

## 2020-11-24 DIAGNOSIS — R7989 Other specified abnormal findings of blood chemistry: Secondary | ICD-10-CM

## 2020-11-24 DIAGNOSIS — I872 Venous insufficiency (chronic) (peripheral): Secondary | ICD-10-CM | POA: Diagnosis not present

## 2020-11-24 DIAGNOSIS — R609 Edema, unspecified: Secondary | ICD-10-CM | POA: Diagnosis not present

## 2020-11-24 DIAGNOSIS — R051 Acute cough: Secondary | ICD-10-CM | POA: Diagnosis not present

## 2020-12-09 DIAGNOSIS — M5416 Radiculopathy, lumbar region: Secondary | ICD-10-CM | POA: Diagnosis not present

## 2020-12-09 DIAGNOSIS — M47816 Spondylosis without myelopathy or radiculopathy, lumbar region: Secondary | ICD-10-CM | POA: Diagnosis not present

## 2020-12-11 ENCOUNTER — Other Ambulatory Visit: Payer: Self-pay

## 2020-12-11 ENCOUNTER — Ambulatory Visit (HOSPITAL_COMMUNITY): Payer: BC Managed Care – PPO | Attending: Cardiovascular Disease

## 2020-12-11 DIAGNOSIS — R7989 Other specified abnormal findings of blood chemistry: Secondary | ICD-10-CM | POA: Diagnosis not present

## 2020-12-11 DIAGNOSIS — Z01818 Encounter for other preprocedural examination: Secondary | ICD-10-CM | POA: Diagnosis not present

## 2020-12-11 LAB — ECHOCARDIOGRAM COMPLETE
Area-P 1/2: 4.39 cm2
S' Lateral: 2.8 cm

## 2020-12-22 DIAGNOSIS — M1611 Unilateral primary osteoarthritis, right hip: Secondary | ICD-10-CM | POA: Diagnosis not present

## 2021-01-07 DIAGNOSIS — D225 Melanocytic nevi of trunk: Secondary | ICD-10-CM | POA: Diagnosis not present

## 2021-01-07 DIAGNOSIS — L821 Other seborrheic keratosis: Secondary | ICD-10-CM | POA: Diagnosis not present

## 2021-01-07 DIAGNOSIS — L570 Actinic keratosis: Secondary | ICD-10-CM | POA: Diagnosis not present

## 2021-02-08 DIAGNOSIS — M5416 Radiculopathy, lumbar region: Secondary | ICD-10-CM | POA: Diagnosis not present

## 2021-02-24 DIAGNOSIS — M5416 Radiculopathy, lumbar region: Secondary | ICD-10-CM | POA: Diagnosis not present

## 2021-02-24 DIAGNOSIS — M47816 Spondylosis without myelopathy or radiculopathy, lumbar region: Secondary | ICD-10-CM | POA: Diagnosis not present

## 2021-03-15 DIAGNOSIS — M47816 Spondylosis without myelopathy or radiculopathy, lumbar region: Secondary | ICD-10-CM | POA: Diagnosis not present

## 2021-03-22 DIAGNOSIS — M47816 Spondylosis without myelopathy or radiculopathy, lumbar region: Secondary | ICD-10-CM | POA: Diagnosis not present

## 2021-04-08 DIAGNOSIS — Z1231 Encounter for screening mammogram for malignant neoplasm of breast: Secondary | ICD-10-CM | POA: Diagnosis not present

## 2021-04-08 DIAGNOSIS — E559 Vitamin D deficiency, unspecified: Secondary | ICD-10-CM | POA: Diagnosis not present

## 2021-04-08 DIAGNOSIS — Z01419 Encounter for gynecological examination (general) (routine) without abnormal findings: Secondary | ICD-10-CM | POA: Diagnosis not present

## 2021-04-08 DIAGNOSIS — Z6839 Body mass index (BMI) 39.0-39.9, adult: Secondary | ICD-10-CM | POA: Diagnosis not present

## 2021-04-13 DIAGNOSIS — Z13820 Encounter for screening for osteoporosis: Secondary | ICD-10-CM | POA: Diagnosis not present

## 2021-04-13 DIAGNOSIS — M4126 Other idiopathic scoliosis, lumbar region: Secondary | ICD-10-CM | POA: Diagnosis not present

## 2021-04-13 DIAGNOSIS — M8588 Other specified disorders of bone density and structure, other site: Secondary | ICD-10-CM | POA: Diagnosis not present

## 2021-04-20 DIAGNOSIS — M47816 Spondylosis without myelopathy or radiculopathy, lumbar region: Secondary | ICD-10-CM | POA: Diagnosis not present

## 2021-05-04 DIAGNOSIS — R946 Abnormal results of thyroid function studies: Secondary | ICD-10-CM | POA: Diagnosis not present

## 2021-05-04 DIAGNOSIS — Z6839 Body mass index (BMI) 39.0-39.9, adult: Secondary | ICD-10-CM | POA: Diagnosis not present

## 2021-05-04 DIAGNOSIS — R7303 Prediabetes: Secondary | ICD-10-CM | POA: Diagnosis not present

## 2021-05-04 DIAGNOSIS — E041 Nontoxic single thyroid nodule: Secondary | ICD-10-CM | POA: Diagnosis not present

## 2021-05-04 DIAGNOSIS — Z8639 Personal history of other endocrine, nutritional and metabolic disease: Secondary | ICD-10-CM | POA: Diagnosis not present

## 2021-05-06 DIAGNOSIS — E042 Nontoxic multinodular goiter: Secondary | ICD-10-CM | POA: Diagnosis not present

## 2021-05-06 DIAGNOSIS — E041 Nontoxic single thyroid nodule: Secondary | ICD-10-CM | POA: Diagnosis not present

## 2021-05-18 DIAGNOSIS — M47816 Spondylosis without myelopathy or radiculopathy, lumbar region: Secondary | ICD-10-CM | POA: Diagnosis not present

## 2021-07-08 DIAGNOSIS — C44329 Squamous cell carcinoma of skin of other parts of face: Secondary | ICD-10-CM | POA: Diagnosis not present

## 2021-07-08 DIAGNOSIS — L578 Other skin changes due to chronic exposure to nonionizing radiation: Secondary | ICD-10-CM | POA: Diagnosis not present

## 2021-07-08 DIAGNOSIS — D492 Neoplasm of unspecified behavior of bone, soft tissue, and skin: Secondary | ICD-10-CM | POA: Diagnosis not present

## 2021-07-20 DIAGNOSIS — Z481 Encounter for planned postprocedural wound closure: Secondary | ICD-10-CM | POA: Diagnosis not present

## 2021-07-20 DIAGNOSIS — C44329 Squamous cell carcinoma of skin of other parts of face: Secondary | ICD-10-CM | POA: Diagnosis not present

## 2021-07-20 DIAGNOSIS — C4492 Squamous cell carcinoma of skin, unspecified: Secondary | ICD-10-CM | POA: Diagnosis not present

## 2021-10-18 ENCOUNTER — Ambulatory Visit (INDEPENDENT_AMBULATORY_CARE_PROVIDER_SITE_OTHER): Payer: BC Managed Care – PPO | Admitting: Podiatry

## 2021-10-18 ENCOUNTER — Ambulatory Visit (INDEPENDENT_AMBULATORY_CARE_PROVIDER_SITE_OTHER): Payer: BC Managed Care – PPO

## 2021-10-18 DIAGNOSIS — M21619 Bunion of unspecified foot: Secondary | ICD-10-CM

## 2021-10-18 DIAGNOSIS — T8484XA Pain due to internal orthopedic prosthetic devices, implants and grafts, initial encounter: Secondary | ICD-10-CM | POA: Diagnosis not present

## 2021-10-18 DIAGNOSIS — M21612 Bunion of left foot: Secondary | ICD-10-CM

## 2021-10-18 DIAGNOSIS — M2012 Hallux valgus (acquired), left foot: Secondary | ICD-10-CM

## 2021-10-20 NOTE — Progress Notes (Signed)
Subjective: 66 y.o. female presents today as a new patient for a second opinion regarding chronic left foot pain.  Patient states that she has had about 5-6 surgeries to the left foot.  She has a past surgical history of bilateral foot surgery.  Last surgery performed to the left foot was by Dr. Victorino Dike, orthopedics, around 2015.  Patient states that she has "unsure what was done but there was a lot of hardware put into the foot".  She was very unsatisfied with the surgical outcome and care provided.  She presents today because she has continued pain to the left foot as well as residual bunion which is very symptomatic despite having prior bunion surgery.  She presents for further treatment and evaluation   Past Medical History:  Diagnosis Date   No pertinent past medical history    Venous insufficiency    Past Surgical History:  Procedure Laterality Date   BREAST LUMPECTOMY Bilateral    3 surgeries   FOOT SURGERY     TUBAL LIGATION     UTERINE FIBROID SURGERY     Allergies  Allergen Reactions   Codeine Nausea And Vomiting    Upset stomach Upset stomach     Objective: Physical Exam General: The patient is alert and oriented x3 in no acute distress.  Dermatology: Skin is cool, dry and supple bilateral lower extremities. Negative for open lesions or macerations.  Vascular: Palpable pedal pulses bilaterally. No edema or erythema noted. Capillary refill within normal limits.  Neurological: Epicritic and protective threshold grossly intact bilaterally.   Musculoskeletal Exam: Clinical evidence of bunion deformity noted to the respective foot. There is moderate pain on palpation range of motion of the first MPJ. Lateral deviation of the hallux noted consistent with hallux abductovalgus.  Radiographic Exam LT foot 10/18/2021: Normal osseous mineralization.  No acute fractures identified.  Increased intermetatarsal angle with an increased hallux abductus angle noted on AP view.   Orthopedic screws x2 noted to the first metatarsal as well as orthopedic screws to the second and third metatarsals and second and third digits of the foot.  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  Residual hallux valgus left 2.  Chronic foot pain left 3.  PSxHx bilateral foot surgery. Last surgery LT foot ~ 2015 Dr. Victorino Dike, orthopedics   Plan of Care:  1. Patient was evaluated. X-Rays reviewed. 2.  Today we had a very detailed and long discussion regarding realistic expectations for the patient.  The patient would prefer not to have surgery however she understands that the residual bunion deformity is incredibly painful and debilitating on a daily basis.  She has tried orthotics, shoe gear modifications, and multiple conservative modalities to try to alleviate her pain without success.  Patient states that the majority of the pain is focused on the bunion deformity.  Recommend that if she is to pursue another surgery to help alleviate pain from her bunion deformity to pursue a first MTP arthrodesis for permanent correction and alignment of the toe.  Risk benefits advantages disadvantages and possible complications were explained in great detail today.  All patient questions were answered.  No guarantees were expressed or implied.  Patient understands that this will not alleviate all of her symptoms however it should alleviate the pain and tenderness that she is experiencing to the first MTP joint of the foot.  The patient would like to pursue the surgical option today.  3.  Authorization  for surgery was initiated today.  Surgery will consist of removal of orthopedic screws x2 left first metatarsal.  First MTP arthrodesis left. 4.  Return to clinic 1 week postop   Felecia Shelling, DPM [facility]  Dr. Felecia Shelling, DPM    [address] [place], [place] [address]                Office  [phone]  Fax [phone]

## 2021-10-26 ENCOUNTER — Telehealth: Payer: Self-pay | Admitting: Urology

## 2021-10-26 NOTE — Telephone Encounter (Addendum)
DOS - 11/25/21  REMOVAL FIXATION DEEP LEFT --- 20680 CHEILECTOMY LEFT --- 46568  [employer] EFFECTIVE DATE - 05/02/21  PLAN DEDUCTIBLE - $7,500.00 W/ $3,862.41 REMAINING OUT OF POCKET - $7,500.00 W/ $3,862.41 REMAINING COINSURANCE - 0% COPAY - $0.00   NO PRIOR AUTH REQUIRED

## 2021-11-04 DIAGNOSIS — M2012 Hallux valgus (acquired), left foot: Secondary | ICD-10-CM | POA: Diagnosis not present

## 2021-11-15 ENCOUNTER — Ambulatory Visit (INDEPENDENT_AMBULATORY_CARE_PROVIDER_SITE_OTHER): Payer: BC Managed Care – PPO | Admitting: Podiatry

## 2021-11-15 DIAGNOSIS — T8484XA Pain due to internal orthopedic prosthetic devices, implants and grafts, initial encounter: Secondary | ICD-10-CM | POA: Diagnosis not present

## 2021-11-15 DIAGNOSIS — M2012 Hallux valgus (acquired), left foot: Secondary | ICD-10-CM | POA: Diagnosis not present

## 2021-11-15 NOTE — Progress Notes (Signed)
Subjective: 66 y.o. female presents today with her husband for follow-up evaluation regarding surgery to the patient's left foot.    Patient states that she has had about 5-6 surgeries to the left foot.  She has a past surgical history of bilateral foot surgery.  Last surgery performed to the left foot was by Dr. Victorino Dike, orthopedics, around 2015.  Patient states that she has "unsure what was done but there was a lot of hardware put into the foot".  She was very unsatisfied with the surgical outcome and care provided.    Last visit on 10/18/2021 we had decided to perform left great toe arthrodesis with removal of the orthopedic screws of the first metatarsal.  The patient is concerned with the postoperative recovery and the ability to be nonweightbearing to the surgical extremity after surgery.  She presents today with questions and with her husband for further treatment and evaluation  Past Medical History:  Diagnosis Date   No pertinent past medical history    Venous insufficiency    Past Surgical History:  Procedure Laterality Date   BREAST LUMPECTOMY Bilateral    3 surgeries   FOOT SURGERY     TUBAL LIGATION     UTERINE FIBROID SURGERY     Allergies  Allergen Reactions   Codeine Nausea And Vomiting    Upset stomach Upset stomach      Objective: Physical Exam General: The patient is alert and oriented x3 in no acute distress.  Dermatology: Skin is cool, dry and supple bilateral lower extremities. Negative for open lesions or macerations.  There is a hyperkeratotic skin lesion and blister development directly overlying the medial aspect of the first MTP left foot.  Vascular: Palpable pedal pulses bilaterally. No edema or erythema noted. Capillary refill within normal limits.  Neurological: Epicritic and protective threshold grossly intact bilaterally.   Musculoskeletal Exam: Clinical evidence of bunion deformity noted to the respective foot.  Today the patient only states  that she has pinpoint pain directly to the skin lesion noted above.  There is no pain with range of motion or palpation of the first MTP joint.  Radiographic Exam LT foot 10/18/2021: Normal osseous mineralization.  No acute fractures identified.  Increased intermetatarsal angle with an increased hallux abductus angle noted on AP view.  There is also some hypertrophic bone development to the medial eminence of the first metatarsal head consistent with the overlying pain and skin lesion noted on above photo.  Orthopedic screws x2 noted to the first metatarsal as well as orthopedic screws to the second and third metatarsals and second and third digits of the foot.  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  Residual hallux valgus left 2.  Chronic foot pain left 3.  PSxHx bilateral foot surgery. Last surgery LT foot ~ 2015 Dr. Victorino Dike, orthopedics   Plan of Care:  1. Patient was evaluated. X-Rays reviewed again today.  All patient questions were answered. 2.  We did discuss different treatment options including conservative care, first MTP arthrodesis, versus simple cheilectomy since the patient only has pinpoint pain to the skin lesion and underlying bone spur.  I do believe a more simple approach and conservative surgical approach would be in the best interest of the patient since she is concerned for postoperative recovery and additional surgery 3.  Authorization for surgery was reinitiated and adjusted today based on follow-up evaluation.  Surgery will consist of removal of orthopedic screws  x2 left first metatarsal.  Cheilectomy first MTP left. 4.  Return to clinic 1 week postop   Felecia Shelling, DPM [facility]  Dr. Felecia Shelling, DPM    [address] [place], [place] [address]                Office [phone]  Fax [phone]

## 2021-11-23 ENCOUNTER — Ambulatory Visit: Payer: BC Managed Care – PPO | Admitting: Podiatry

## 2021-11-25 ENCOUNTER — Other Ambulatory Visit: Payer: Self-pay | Admitting: Podiatry

## 2021-11-25 ENCOUNTER — Telehealth: Payer: Self-pay

## 2021-11-25 DIAGNOSIS — Z472 Encounter for removal of internal fixation device: Secondary | ICD-10-CM | POA: Diagnosis not present

## 2021-11-25 DIAGNOSIS — T8484XA Pain due to internal orthopedic prosthetic devices, implants and grafts, initial encounter: Secondary | ICD-10-CM | POA: Diagnosis not present

## 2021-11-25 DIAGNOSIS — M2012 Hallux valgus (acquired), left foot: Secondary | ICD-10-CM | POA: Diagnosis not present

## 2021-11-25 DIAGNOSIS — G8918 Other acute postprocedural pain: Secondary | ICD-10-CM | POA: Diagnosis not present

## 2021-11-25 DIAGNOSIS — Z4889 Encounter for other specified surgical aftercare: Secondary | ICD-10-CM | POA: Diagnosis not present

## 2021-11-25 MED ORDER — IBUPROFEN 800 MG PO TABS: 800.0000 mg | ORAL_TABLET | Freq: Three times a day (TID) | ORAL | 1 refills | Status: DC

## 2021-11-25 MED ORDER — HYDROMORPHONE HCL 4 MG PO TABS: 4.0000 mg | ORAL_TABLET | ORAL | 0 refills | Status: AC | PRN

## 2021-11-25 MED ORDER — IBUPROFEN 800 MG PO TABS: 800.0000 mg | ORAL_TABLET | Freq: Three times a day (TID) | ORAL | 1 refills | Status: AC

## 2021-11-25 MED ORDER — HYDROMORPHONE HCL 4 MG PO TABS: 4.0000 mg | ORAL_TABLET | ORAL | 0 refills | Status: DC | PRN

## 2021-11-25 NOTE — Telephone Encounter (Signed)
Prescriptions sent to [facility] in [place]. - Dr. Logan Bores

## 2021-11-25 NOTE — Telephone Encounter (Signed)
Pts husband called back and was asking if rx's were sent and upon checking it had not been.  We got ahold of Dr Logan Bores and had it changed and I notified pts husband rx was sent. He said thank you so much.

## 2021-11-25 NOTE — Telephone Encounter (Signed)
Pt husband called stating that [facility] wont accept the Rx because the strength is too strong. Pt wants to have it transferred to [facility] [place] [facility] [address] [facility], [phone]  Please advise

## 2021-11-25 NOTE — Progress Notes (Signed)
PRN postop 

## 2021-11-26 ENCOUNTER — Telehealth: Payer: Self-pay

## 2021-11-26 ENCOUNTER — Other Ambulatory Visit: Payer: Self-pay | Admitting: Podiatry

## 2021-11-26 MED ORDER — PROMETHAZINE HCL 25 MG PO TABS: 25.0000 mg | ORAL_TABLET | Freq: Three times a day (TID) | ORAL | 0 refills | Status: AC | PRN

## 2021-11-26 NOTE — Telephone Encounter (Signed)
Spoke with patient on the phone.  Sent in a prescription for Phenergan.  She is doing well.  Will follow-up neck scheduled appointment.  Thanks, Dr. Logan Bores

## 2021-11-26 NOTE — Telephone Encounter (Signed)
Noted, thanks!

## 2021-11-26 NOTE — Progress Notes (Signed)
As needed postop 

## 2021-12-01 ENCOUNTER — Ambulatory Visit (INDEPENDENT_AMBULATORY_CARE_PROVIDER_SITE_OTHER): Payer: BC Managed Care – PPO | Admitting: Podiatry

## 2021-12-01 ENCOUNTER — Ambulatory Visit (INDEPENDENT_AMBULATORY_CARE_PROVIDER_SITE_OTHER): Payer: BC Managed Care – PPO

## 2021-12-01 DIAGNOSIS — M2012 Hallux valgus (acquired), left foot: Secondary | ICD-10-CM | POA: Diagnosis not present

## 2021-12-01 DIAGNOSIS — Z9889 Other specified postprocedural states: Secondary | ICD-10-CM

## 2021-12-01 NOTE — Progress Notes (Signed)
   Chief Complaint  Patient presents with   Post-op Follow-up     POV #1 DOS 11/25/2021 --REMOVAL OF ORTHOPEDIC SCREW x's 2 LT 1ST METATARSAL, CHEILECTOMY LEFT GREAT TOE JOINT    Subjective:  Patient presents today status post silver type bunionectomy with removal of orthopedic screws left foot. DOS: 11/25/2021.  Patient states that she is doing very well.  Minimal pain.  She is ambulating just fine in the postsurgical shoe.  The dressings have been clean and dry this week no new complaints at this time  Past Medical History:  Diagnosis Date   No pertinent past medical history    Venous insufficiency     Past Surgical History:  Procedure Laterality Date   BREAST LUMPECTOMY Bilateral    3 surgeries   FOOT SURGERY     TUBAL LIGATION     UTERINE FIBROID SURGERY      Allergies  Allergen Reactions   Codeine Nausea And Vomiting    Upset stomach Upset stomach    Objective/Physical Exam Neurovascular status intact.  Skin incisions appear to be well coapted with sutures intact. No sign of infectious process noted.  No drainage.  No dehiscence. No active bleeding noted. Moderate edema noted to the surgical extremity with some ecchymosis and pooling around the toes of the surgical foot  Radiographic Exam:  Osteotomy of the medial aspect of the first metatarsal appears clean with routine healing.  Absence of the orthopedic screws noted to the first metatarsal.  Assessment: 1. s/p silver type bunionectomy with removal of orthopedic screws left foot. DOS: 11/25/2021   Plan of Care:  1. Patient was evaluated. X-rays reviewed 2.  Overall the patient is doing very well.  She may begin washing and showering and getting the foot wet 3.  Recommend nonadherent gauze pad over the incision sites which was provided for the patient and Ace wrap daily 4.  Return to clinic in 1 week for possible suture removal   Felecia Shelling, DPM [facility]  Dr. Felecia Shelling, DPM    [address] [place], [place] [address]                Office [phone]  Fax [phone]

## 2021-12-08 ENCOUNTER — Ambulatory Visit (INDEPENDENT_AMBULATORY_CARE_PROVIDER_SITE_OTHER): Payer: BC Managed Care – PPO | Admitting: Podiatry

## 2021-12-08 DIAGNOSIS — Z9889 Other specified postprocedural states: Secondary | ICD-10-CM

## 2021-12-08 NOTE — Progress Notes (Signed)
   Chief Complaint  Patient presents with   Post-op Follow-up    Post op #2 REMOVAL of sutures left foot, patient states that he is having a lot of swelling in left foot.    Subjective:  Patient presents today status post silver type bunionectomy with removal of orthopedic screws left foot. DOS: 11/25/2021.  Patient states that she is doing well.  She has noticed increased swelling over the past week.  Her husband states that he believes it is because of her increased activity.  She presents for further treatment and evaluation  Past Medical History:  Diagnosis Date   No pertinent past medical history    Venous insufficiency     Past Surgical History:  Procedure Laterality Date   BREAST LUMPECTOMY Bilateral    3 surgeries   FOOT SURGERY     TUBAL LIGATION     UTERINE FIBROID SURGERY      Allergies  Allergen Reactions   Codeine Nausea And Vomiting    Upset stomach Upset stomach    Objective/Physical Exam Neurovascular status intact.  Skin incisions appear to be well coapted with sutures intact. No sign of infectious process noted.  No drainage.  No dehiscence. No active bleeding noted.  There is some increased edema noted to the surgical extremity today however the ecchymosis around the toes has improved significantly over the past week  Radiographic Exam LT foot 12/01/2021:  Osteotomy of the medial aspect of the first metatarsal appears clean with routine healing.  Absence of the orthopedic screws noted to the first metatarsal.  Assessment: 1. s/p silver type bunionectomy with removal of orthopedic screws left foot. DOS: 11/25/2021   Plan of Care:  1. Patient was evaluated.  2.  Partial sutures removed.  The sutures to the central portion of the incision were left intact.  Will plan to remove those next week 3.  Continue compression with Ace wrap daily 4.  Continue minimal weightbearing with rest and elevation 5.  Continue ice daily 6.  Return to clinic in 1 week for  remaining suture removal  Felecia Shelling, DPM [facility]  Dr. Felecia Shelling, DPM    [address] [place], [place] [address]                Office [phone]  Fax [phone]

## 2021-12-15 ENCOUNTER — Ambulatory Visit (INDEPENDENT_AMBULATORY_CARE_PROVIDER_SITE_OTHER): Payer: BC Managed Care – PPO

## 2021-12-15 ENCOUNTER — Ambulatory Visit (INDEPENDENT_AMBULATORY_CARE_PROVIDER_SITE_OTHER): Payer: BC Managed Care – PPO | Admitting: Podiatry

## 2021-12-15 DIAGNOSIS — Z9889 Other specified postprocedural states: Secondary | ICD-10-CM

## 2021-12-15 NOTE — Progress Notes (Signed)
   Chief Complaint  Patient presents with   Post-op Follow-up    POV #3 DOS 11/25/2021--REMOVAL OF ORTHOPEDIC SCREW x's 2 LT 1ST METATARSAL, CHEILECTOMY LEFT GREAT TOE JOINT    Subjective:  Patient presents today status post silver type bunionectomy with removal of orthopedic screws left foot. DOS: 11/25/2021.  Patient continues to do well.  She has been weightbearing in the surgical shoe.  No new complaints at this time Past Medical History:  Diagnosis Date   No pertinent past medical history    Venous insufficiency     Past Surgical History:  Procedure Laterality Date   BREAST LUMPECTOMY Bilateral    3 surgeries   FOOT SURGERY     TUBAL LIGATION     UTERINE FIBROID SURGERY      Allergies  Allergen Reactions   Codeine Nausea And Vomiting    Upset stomach Upset stomach    Objective/Physical Exam Neurovascular status intact.  Well healing surgical foot.  Skin incisions are well coapted and intact.  After removal of the central portion of the sutures the skin incisions are healed completely.  No erythema or edema noted.  Negative for any significant pain with palpation  Radiographic Exam LT foot 12/15/2021:  Osteotomy of the medial aspect of the first metatarsal appears clean with routine healing.  Absence of the orthopedic screws noted to the first metatarsal.  No significant change since prior x-rays.  Good stable routine healing  Assessment: 1. s/p silver type bunionectomy with removal of orthopedic screws left foot. DOS: 11/25/2021   Plan of Care:  1. Patient was evaluated.  2.  Remaining sutures were removed from the central portion of the incision site. 3.  The incision has healed nicely.  Complete reepithelialization has occurred.  There is no erythema or edema 4.  Recommend that the patient wears the postsurgical shoe for 1 additional week.  Beginning next week she may begin to transition into good comfortable wide fitting sneakers that allow plenty of room in the  forefoot and toebox area 5.  Slowly increase to full activity no restrictions 6.  Return to clinic as needed  Felecia Shelling, DPM [facility]  Dr. Felecia Shelling, DPM    [address] [place], [place] [address]                Office [phone]  Fax [phone]

## 2021-12-15 NOTE — Progress Notes (Signed)
Dg  

## 2021-12-22 ENCOUNTER — Encounter: Payer: BC Managed Care – PPO | Admitting: Podiatry

## 2021-12-27 ENCOUNTER — Telehealth: Payer: Self-pay | Admitting: Podiatry

## 2021-12-27 NOTE — Telephone Encounter (Signed)
Patient husband called  - the pharmacy called them and said Dr Logan Bores called in a prescription for her, nothing is showing in the chart and she did not need any medication called in, Please call

## 2021-12-27 NOTE — Telephone Encounter (Signed)
Spoke with patient.  Answered questions.

## 2022-01-12 DIAGNOSIS — M47816 Spondylosis without myelopathy or radiculopathy, lumbar region: Secondary | ICD-10-CM | POA: Diagnosis not present

## 2022-01-12 DIAGNOSIS — M419 Scoliosis, unspecified: Secondary | ICD-10-CM | POA: Diagnosis not present

## 2022-01-17 DIAGNOSIS — L905 Scar conditions and fibrosis of skin: Secondary | ICD-10-CM | POA: Diagnosis not present

## 2022-01-17 DIAGNOSIS — L820 Inflamed seborrheic keratosis: Secondary | ICD-10-CM | POA: Diagnosis not present

## 2022-02-09 DIAGNOSIS — M47816 Spondylosis without myelopathy or radiculopathy, lumbar region: Secondary | ICD-10-CM | POA: Diagnosis not present

## 2022-02-10 ENCOUNTER — Telehealth: Payer: Self-pay | Admitting: Podiatry

## 2022-02-10 NOTE — Telephone Encounter (Signed)
Patient is calling to speak with Nurse about her medication

## 2022-02-11 NOTE — Telephone Encounter (Signed)
Called patient, no answer, left a voice message to call back for medication concerns.

## 2022-02-23 DIAGNOSIS — M47816 Spondylosis without myelopathy or radiculopathy, lumbar region: Secondary | ICD-10-CM | POA: Diagnosis not present

## 2022-03-15 DIAGNOSIS — M47816 Spondylosis without myelopathy or radiculopathy, lumbar region: Secondary | ICD-10-CM | POA: Diagnosis not present

## 2022-03-29 ENCOUNTER — Other Ambulatory Visit: Payer: Self-pay | Admitting: Rehabilitation

## 2022-03-29 DIAGNOSIS — M5416 Radiculopathy, lumbar region: Secondary | ICD-10-CM

## 2022-04-05 ENCOUNTER — Ambulatory Visit
Admission: RE | Admit: 2022-04-05 | Discharge: 2022-04-05 | Disposition: A | Discharge status: Home / Self Care | Payer: BC Managed Care – PPO | Source: Ambulatory Visit | Attending: Rehabilitation | Admitting: Rehabilitation

## 2022-04-05 DIAGNOSIS — M545 Low back pain, unspecified: Secondary | ICD-10-CM | POA: Diagnosis not present

## 2022-04-05 DIAGNOSIS — M4126 Other idiopathic scoliosis, lumbar region: Secondary | ICD-10-CM | POA: Diagnosis not present

## 2022-04-05 DIAGNOSIS — M48061 Spinal stenosis, lumbar region without neurogenic claudication: Secondary | ICD-10-CM | POA: Diagnosis not present

## 2022-04-05 DIAGNOSIS — M5416 Radiculopathy, lumbar region: Secondary | ICD-10-CM

## 2022-04-11 DIAGNOSIS — M47816 Spondylosis without myelopathy or radiculopathy, lumbar region: Secondary | ICD-10-CM | POA: Diagnosis not present

## 2022-04-13 ENCOUNTER — Other Ambulatory Visit: Payer: Self-pay | Admitting: Rehabilitation

## 2022-04-13 DIAGNOSIS — M47816 Spondylosis without myelopathy or radiculopathy, lumbar region: Secondary | ICD-10-CM

## 2022-04-21 ENCOUNTER — Ambulatory Visit
Admission: RE | Admit: 2022-04-21 | Discharge: 2022-04-21 | Disposition: A | Discharge status: Home / Self Care | Payer: BC Managed Care – PPO | Source: Ambulatory Visit | Attending: Rehabilitation | Admitting: Rehabilitation

## 2022-04-21 DIAGNOSIS — M47816 Spondylosis without myelopathy or radiculopathy, lumbar region: Secondary | ICD-10-CM

## 2022-06-06 DIAGNOSIS — M5416 Radiculopathy, lumbar region: Secondary | ICD-10-CM | POA: Diagnosis not present

## 2022-07-04 DIAGNOSIS — M5416 Radiculopathy, lumbar region: Secondary | ICD-10-CM | POA: Diagnosis not present

## 2022-07-04 DIAGNOSIS — M47816 Spondylosis without myelopathy or radiculopathy, lumbar region: Secondary | ICD-10-CM | POA: Diagnosis not present

## 2022-07-25 DIAGNOSIS — H43813 Vitreous degeneration, bilateral: Secondary | ICD-10-CM | POA: Diagnosis not present

## 2022-08-08 DIAGNOSIS — M5416 Radiculopathy, lumbar region: Secondary | ICD-10-CM | POA: Diagnosis not present

## 2022-08-29 ENCOUNTER — Other Ambulatory Visit: Payer: Self-pay | Admitting: Internal Medicine

## 2022-08-29 ENCOUNTER — Ambulatory Visit
Admission: RE | Admit: 2022-08-29 | Discharge: 2022-08-29 | Disposition: A | Discharge status: Home / Self Care | Payer: Medicare HMO | Source: Ambulatory Visit | Attending: Internal Medicine | Admitting: Internal Medicine

## 2022-08-29 DIAGNOSIS — R053 Chronic cough: Secondary | ICD-10-CM

## 2022-08-30 DIAGNOSIS — M47816 Spondylosis without myelopathy or radiculopathy, lumbar region: Secondary | ICD-10-CM | POA: Diagnosis not present

## 2022-08-30 DIAGNOSIS — M5416 Radiculopathy, lumbar region: Secondary | ICD-10-CM | POA: Diagnosis not present

## 2022-10-19 ENCOUNTER — Ambulatory Visit (INDEPENDENT_AMBULATORY_CARE_PROVIDER_SITE_OTHER): Payer: Medicare HMO

## 2022-10-19 ENCOUNTER — Ambulatory Visit: Payer: Medicare HMO | Admitting: Podiatry

## 2022-10-19 ENCOUNTER — Encounter: Payer: Self-pay | Admitting: Podiatry

## 2022-10-19 DIAGNOSIS — M2012 Hallux valgus (acquired), left foot: Secondary | ICD-10-CM

## 2022-10-19 NOTE — Progress Notes (Signed)
Chief Complaint  Patient presents with   Toe Pain    "I want him to look at my toe.  Since he did surgery on it, it has slumped over, there's no support."    Subjective: 67 y.o. female presents today for follow-up evaluation of chronic severe hallux valgus to the left foot.  Patient has history of multiple surgeries and last year the patient opted for simple cheilectomy with removal orthopedic screws to the first metatarsal of the left foot.  First MTP arthrodesis to the great toe was discussed but ultimately the patient opted for more simple approach.  Patient states that she has had about 5-6 surgeries to the left foot.  History of left foot surgery with Dr. Victorino Dike, orthopedics, around 2015.  Patient states that she has "unsure what was done but there was a lot of hardware put into the foot".  She was very unsatisfied with the surgical outcome and care provided.    Patient presents for further treatment and evaluation   Past Medical History:  Diagnosis Date   No pertinent past medical history    Venous insufficiency    Past Surgical History:  Procedure Laterality Date   BREAST LUMPECTOMY Bilateral    3 surgeries   FOOT SURGERY     TUBAL LIGATION     UTERINE FIBROID SURGERY     Allergies  Allergen Reactions   Codeine Nausea And Vomiting    Upset stomach Upset stomach     Objective: Physical Exam General: The patient is alert and oriented x3 in no acute distress.  Dermatology: Skin is cool, dry and supple bilateral lower extremities. Negative for open lesions or macerations.  Vascular: Palpable pedal pulses bilaterally. No edema or erythema noted. Capillary refill within normal limits.  Neurological: Light touch and protective threshold grossly intact bilaterally.   Musculoskeletal Exam: Clinical evidence of bunion deformity noted to the respective foot. There is moderate pain on palpation range of motion of the first MPJ. Lateral deviation of the hallux noted consistent  with hallux abductovalgus.  Radiographic Exam LT foot 10/19/2022: Advanced generative changes noted to the first MTP of the left foot.  Increased intermetatarsal angle with an increased hallux abductus angle noted on AP view.  Orthopedic screws to the second and third metatarsals and second and third digits of the foot.  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  Residual hallux valgus left 2.  Advanced severe DJD first MTP left 3.  PSxHx bilateral foot surgery. Last surgery LT foot ~ 2015 Dr. Victorino Dike, orthopedics   Plan of Care:  -Patient was evaluated. X-Rays reviewed. -Unfortunately patient has had significant progression of the osteoarthritic changes and degenerative changes noted to the first MTP of the left foot.  Although she is very hesitant for surgery I did would recommend first MTP arthrodesis to better align the great toe in a rectus alignment.  Risk benefits advantages disadvantages of the procedure as well as the postoperative recovery course were explained in great detail with the patient.  All patient questions were answered.  No guarantees were expressed or implied.  Recommend approximately 2 weeks nonweightbearing in the cam boot.  Patient is amenable to this plan -Authorization for surgery was initiated today.  Surgery will consist of First MTP arthrodesis left. -Return to clinic 1 week postop   Felecia Shelling, DPM [facility]  Dr. Felecia Shelling, DPM    [address], [place] [address]                Office [phone]  Fax [phone]

## 2022-10-21 ENCOUNTER — Telehealth: Payer: Self-pay | Admitting: Urology

## 2022-10-21 NOTE — Telephone Encounter (Signed)
DOS - 11/17/22  HALLUX MPJ FUSION RIGHT --- 09811  [employer] EFFECTIVE DATE - 06/02/22  PER [employer]'S AUTOMATED SYSTEM FOR CPT CODE 91478 NO PRIOR AUTH IS REQUIRED.   CALL REF # [id number]

## 2022-10-27 ENCOUNTER — Telehealth: Payer: Self-pay | Admitting: Urology

## 2022-10-27 NOTE — Telephone Encounter (Signed)
Pt called saying she is very hesitate about using the knee scooter after sx on 11/17/22. She stated that you told her she would need to use it after her sx and she doesn't feel comfortable with a knee scooter. I explained to the pt that she would be NWB in cam boot 2 wks after sx (per your note) that she can not put any weight on that sx foot. Pt asked if there was something else she could use. I asked her if she would be comfortable with crutches. She said yes, she has used them in the past. Would you be ok if pt used crutches instead of knee scooter after sx? Please Advise. Thanks.

## 2022-11-07 ENCOUNTER — Telehealth: Payer: Self-pay | Admitting: Podiatry

## 2022-11-07 NOTE — Telephone Encounter (Signed)
PT CALLED IN TO CANCEL SURGERY ON 11/17/22 DUE TO "BAD TIMING". NOTIFIED DR EVANS AND [facility].

## 2022-11-13 ENCOUNTER — Encounter: Payer: Self-pay | Admitting: Podiatry

## 2022-11-23 ENCOUNTER — Encounter: Payer: Medicare HMO | Admitting: Podiatry

## 2022-11-30 ENCOUNTER — Encounter: Payer: Medicare HMO | Admitting: Podiatry

## 2022-12-14 ENCOUNTER — Encounter: Payer: Medicare HMO | Admitting: Podiatry

## 2022-12-16 DIAGNOSIS — Z96641 Presence of right artificial hip joint: Secondary | ICD-10-CM | POA: Diagnosis not present

## 2022-12-20 DIAGNOSIS — M47816 Spondylosis without myelopathy or radiculopathy, lumbar region: Secondary | ICD-10-CM | POA: Diagnosis not present

## 2022-12-20 DIAGNOSIS — M5416 Radiculopathy, lumbar region: Secondary | ICD-10-CM | POA: Diagnosis not present

## 2023-01-03 DIAGNOSIS — M5416 Radiculopathy, lumbar region: Secondary | ICD-10-CM | POA: Diagnosis not present

## 2023-01-11 DIAGNOSIS — H25813 Combined forms of age-related cataract, bilateral: Secondary | ICD-10-CM | POA: Diagnosis not present

## 2023-01-11 DIAGNOSIS — H52221 Regular astigmatism, right eye: Secondary | ICD-10-CM | POA: Diagnosis not present

## 2023-01-11 DIAGNOSIS — H5213 Myopia, bilateral: Secondary | ICD-10-CM | POA: Diagnosis not present

## 2023-01-11 DIAGNOSIS — H524 Presbyopia: Secondary | ICD-10-CM | POA: Diagnosis not present

## 2023-01-31 DIAGNOSIS — M5416 Radiculopathy, lumbar region: Secondary | ICD-10-CM | POA: Diagnosis not present

## 2023-02-15 DIAGNOSIS — R69 Illness, unspecified: Secondary | ICD-10-CM | POA: Diagnosis not present

## 2023-02-20 DIAGNOSIS — M5416 Radiculopathy, lumbar region: Secondary | ICD-10-CM | POA: Diagnosis not present

## 2023-03-24 DIAGNOSIS — M5416 Radiculopathy, lumbar region: Secondary | ICD-10-CM | POA: Diagnosis not present

## 2023-03-31 IMAGING — CR DG CHEST 2V
2 series · 2 of 2 positions shown · non-contrast
Comparison: 05/09/2017

CLINICAL DATA: Chronic cough

EXAM:
CHEST - 2 VIEW

[w chest pa]
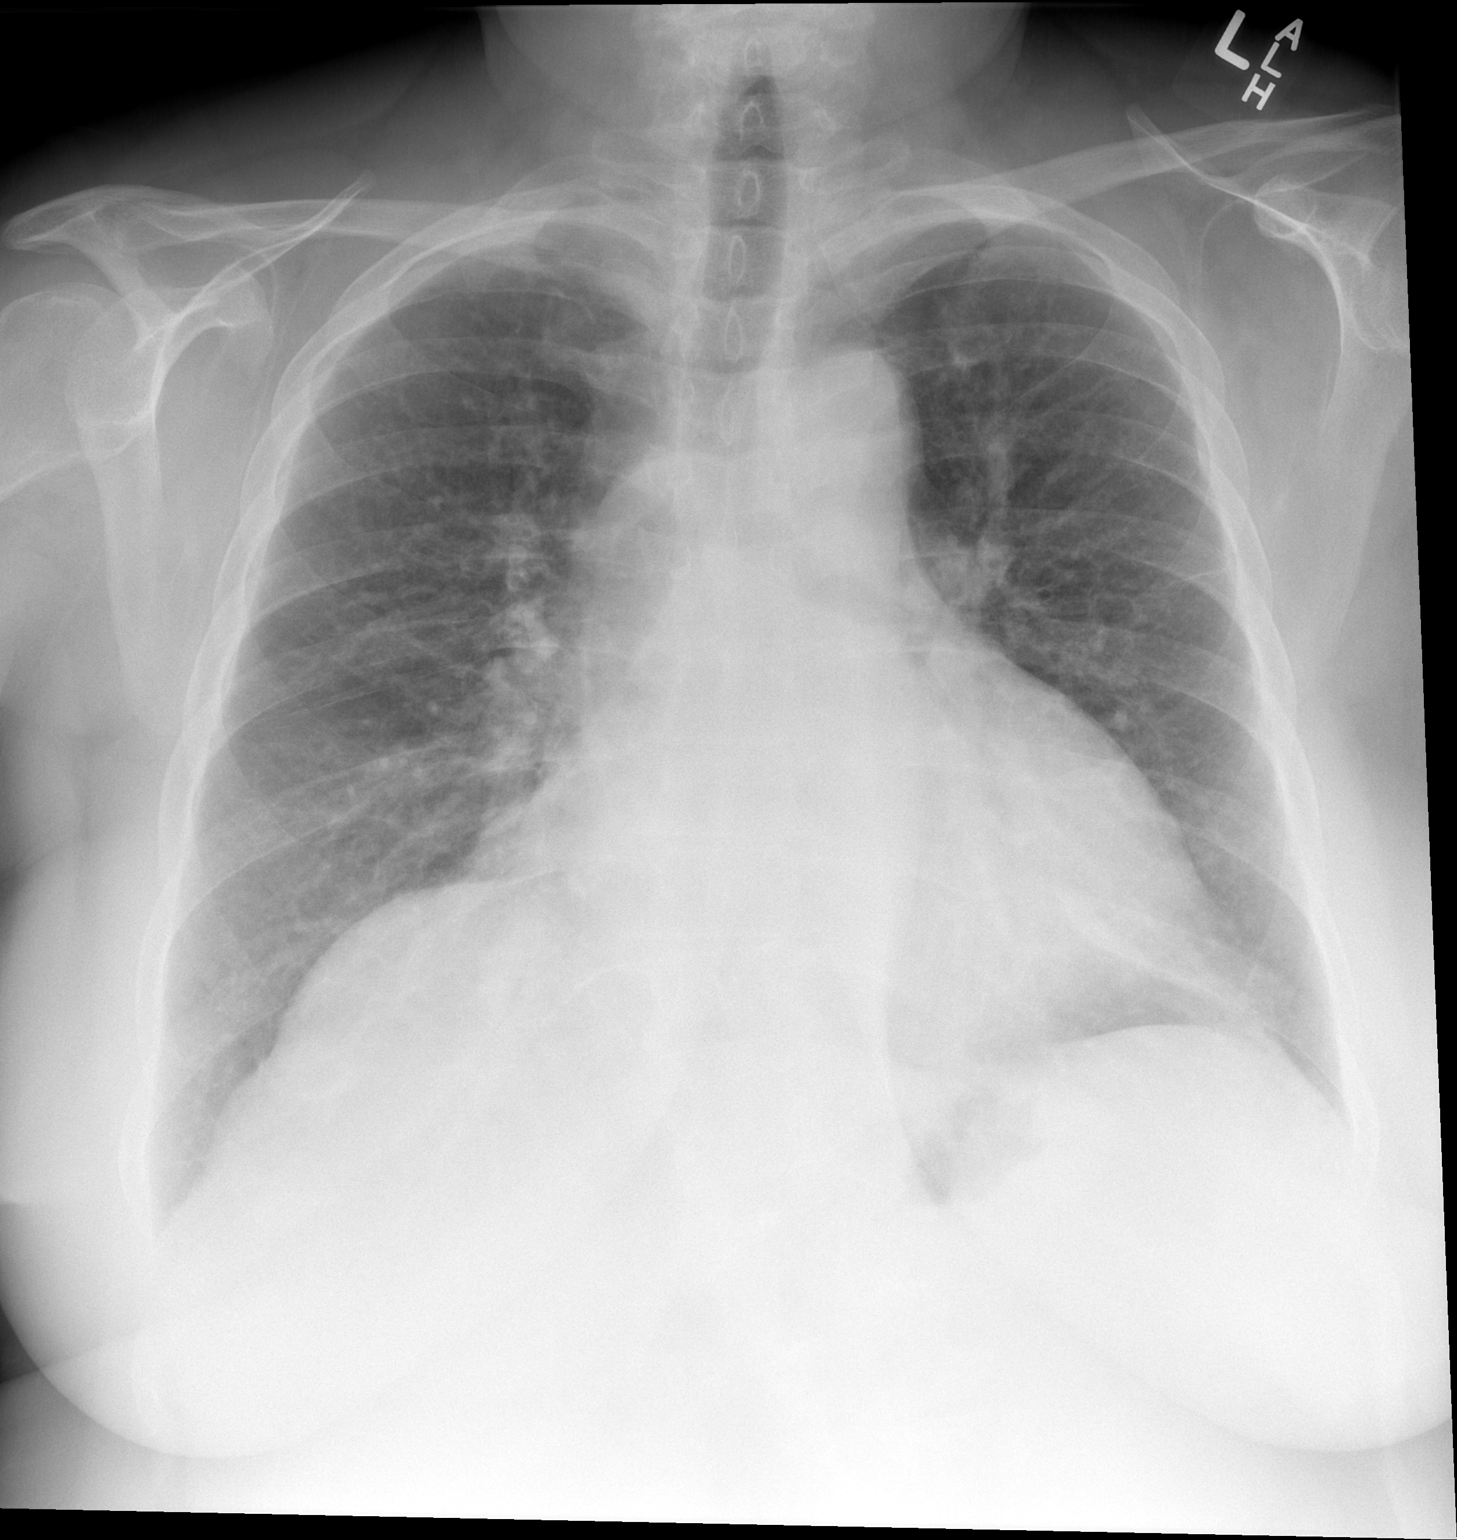

[w chest lat]
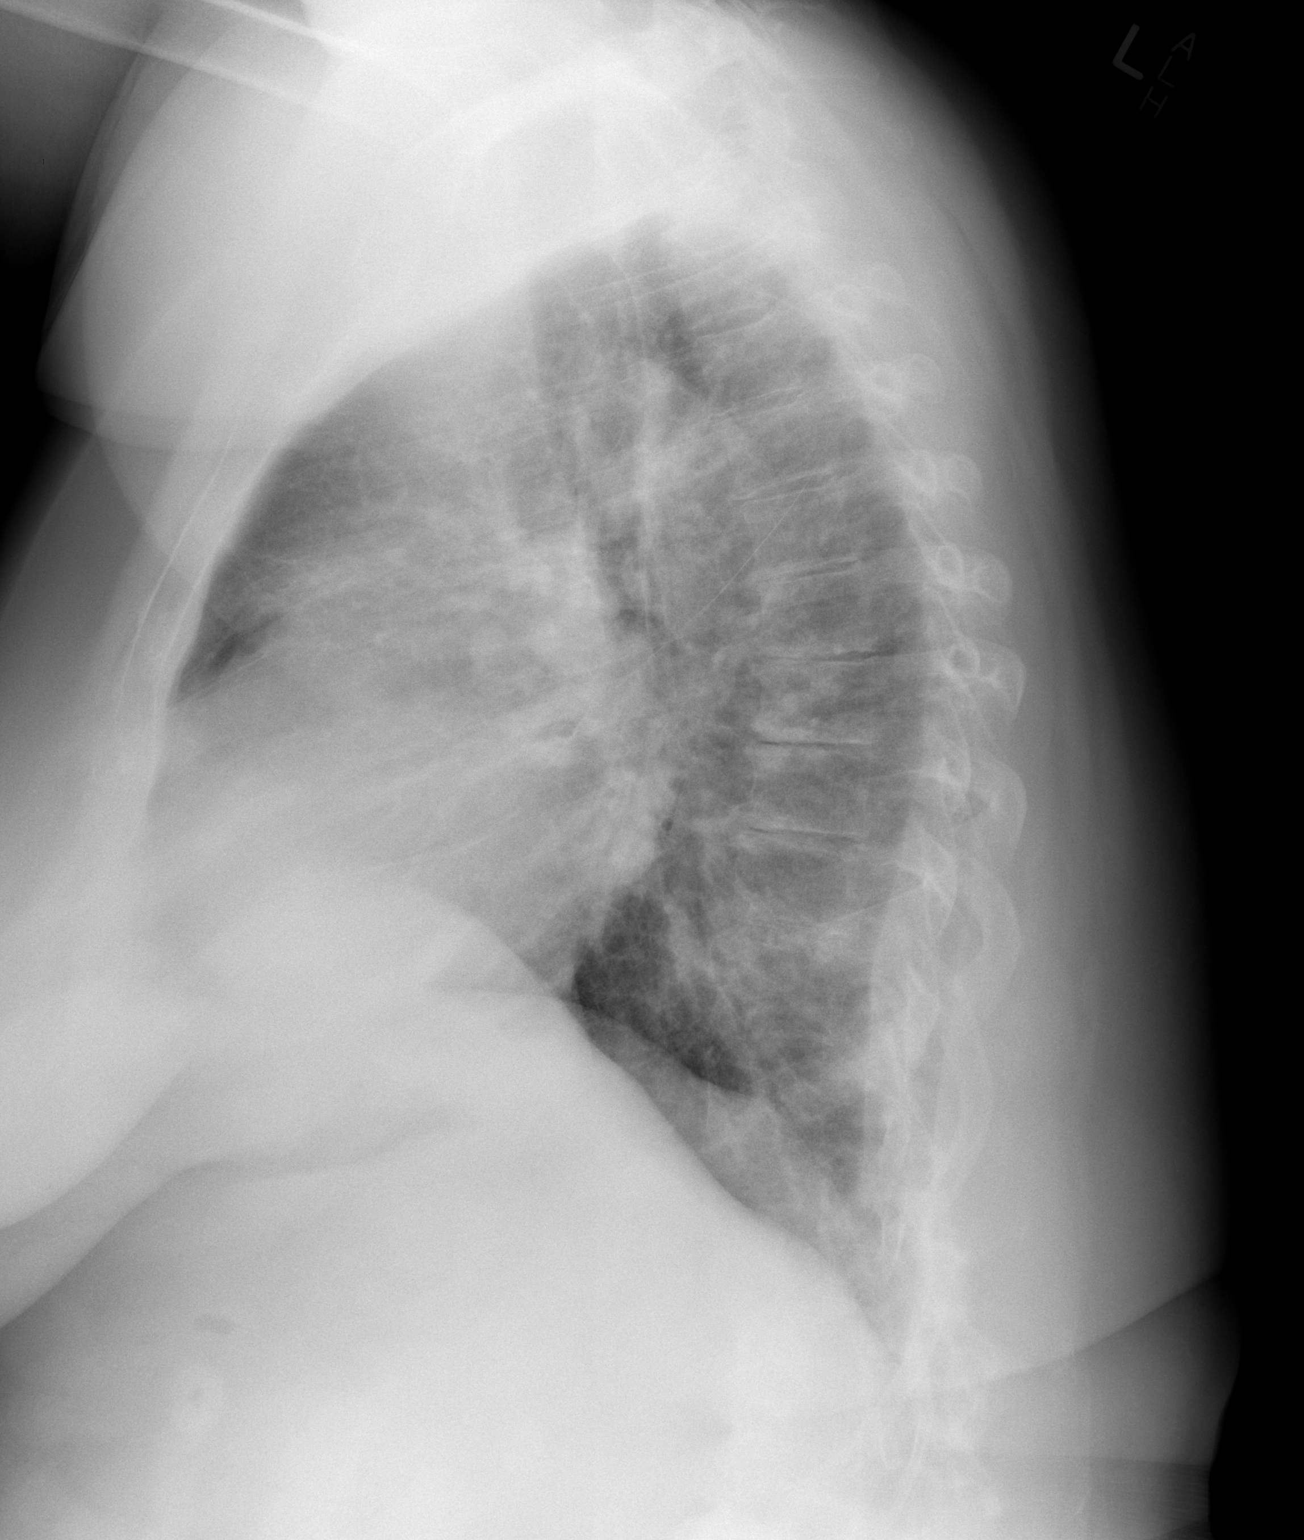

[2 of 2 positions shown; findings below may reference images not displayed]

FINDINGS: No focal consolidation. No pleural effusion or pneumothorax. Mild
cardiomegaly.

No acute osseous abnormality.
IMPRESSION: No active cardiopulmonary disease.

## 2023-04-16 DIAGNOSIS — N390 Urinary tract infection, site not specified: Secondary | ICD-10-CM | POA: Diagnosis not present

## 2023-05-11 DIAGNOSIS — N390 Urinary tract infection, site not specified: Secondary | ICD-10-CM | POA: Diagnosis not present

## 2023-05-11 DIAGNOSIS — R102 Pelvic and perineal pain: Secondary | ICD-10-CM | POA: Diagnosis not present

## 2023-05-11 DIAGNOSIS — R300 Dysuria: Secondary | ICD-10-CM | POA: Diagnosis not present

## 2023-05-18 DIAGNOSIS — R051 Acute cough: Secondary | ICD-10-CM | POA: Diagnosis not present

## 2023-05-18 DIAGNOSIS — J069 Acute upper respiratory infection, unspecified: Secondary | ICD-10-CM | POA: Diagnosis not present

## 2023-05-18 DIAGNOSIS — I517 Cardiomegaly: Secondary | ICD-10-CM | POA: Diagnosis not present

## 2023-05-18 DIAGNOSIS — Z03818 Encounter for observation for suspected exposure to other biological agents ruled out: Secondary | ICD-10-CM | POA: Diagnosis not present
